# Patient Record
Sex: Female | Born: 1935 | Race: White | Hispanic: No | Smoking: Former smoker
Health system: Southern US, Community
[De-identification: ages and names within clinical notes are randomized; demographics above are authoritative.]

## PROBLEM LIST (undated history)

## (undated) DIAGNOSIS — F32A Depression, unspecified: Secondary | ICD-10-CM

## (undated) DIAGNOSIS — E079 Disorder of thyroid, unspecified: Secondary | ICD-10-CM

## (undated) DIAGNOSIS — I509 Heart failure, unspecified: Secondary | ICD-10-CM

## (undated) DIAGNOSIS — C801 Malignant (primary) neoplasm, unspecified: Secondary | ICD-10-CM

## (undated) DIAGNOSIS — R918 Other nonspecific abnormal finding of lung field: Secondary | ICD-10-CM

## (undated) DIAGNOSIS — I10 Essential (primary) hypertension: Secondary | ICD-10-CM

## (undated) DIAGNOSIS — J9611 Chronic respiratory failure with hypoxia: Secondary | ICD-10-CM

## (undated) DIAGNOSIS — F0390 Unspecified dementia without behavioral disturbance: Secondary | ICD-10-CM

## (undated) DIAGNOSIS — I4891 Unspecified atrial fibrillation: Secondary | ICD-10-CM

## (undated) DIAGNOSIS — F329 Major depressive disorder, single episode, unspecified: Secondary | ICD-10-CM

## (undated) DIAGNOSIS — K219 Gastro-esophageal reflux disease without esophagitis: Secondary | ICD-10-CM

## (undated) DIAGNOSIS — J449 Chronic obstructive pulmonary disease, unspecified: Secondary | ICD-10-CM

## (undated) DIAGNOSIS — D649 Anemia, unspecified: Secondary | ICD-10-CM

---

## 1997-05-29 ENCOUNTER — Other Ambulatory Visit: Admission: RE | Admit: 1997-05-29 | Discharge: 1997-05-29 | Payer: Self-pay | Admitting: Obstetrics and Gynecology

## 1998-08-02 ENCOUNTER — Other Ambulatory Visit: Admission: RE | Admit: 1998-08-02 | Discharge: 1998-08-02 | Payer: Self-pay | Admitting: Obstetrics and Gynecology

## 1999-11-15 ENCOUNTER — Other Ambulatory Visit: Admission: RE | Admit: 1999-11-15 | Discharge: 1999-11-15 | Payer: Self-pay | Admitting: Obstetrics and Gynecology

## 2007-07-29 ENCOUNTER — Ambulatory Visit (HOSPITAL_COMMUNITY): Admission: RE | Admit: 2007-07-29 | Discharge: 2007-07-29 | Payer: Self-pay | Admitting: Ophthalmology

## 2007-08-25 ENCOUNTER — Emergency Department (HOSPITAL_COMMUNITY): Admission: EM | Admit: 2007-08-25 | Discharge: 2007-08-25 | Payer: Self-pay | Admitting: Emergency Medicine

## 2007-09-02 ENCOUNTER — Ambulatory Visit (HOSPITAL_COMMUNITY): Admission: RE | Admit: 2007-09-02 | Discharge: 2007-09-02 | Payer: Self-pay | Admitting: Ophthalmology

## 2007-12-29 ENCOUNTER — Ambulatory Visit: Payer: Self-pay | Admitting: Cardiology

## 2008-10-25 ENCOUNTER — Ambulatory Visit: Payer: Self-pay | Admitting: Cardiology

## 2008-11-20 ENCOUNTER — Inpatient Hospital Stay: Admission: AD | Admit: 2008-11-20 | Discharge: 2008-12-20 | Payer: Self-pay | Admitting: Internal Medicine

## 2008-12-05 ENCOUNTER — Ambulatory Visit (HOSPITAL_COMMUNITY): Admission: RE | Admit: 2008-12-05 | Discharge: 2008-12-05 | Payer: Self-pay | Admitting: Internal Medicine

## 2008-12-06 ENCOUNTER — Encounter (HOSPITAL_BASED_OUTPATIENT_CLINIC_OR_DEPARTMENT_OTHER): Payer: Self-pay | Admitting: Internal Medicine

## 2008-12-06 ENCOUNTER — Encounter: Payer: Self-pay | Admitting: Orthopedic Surgery

## 2008-12-06 ENCOUNTER — Ambulatory Visit (HOSPITAL_COMMUNITY): Admission: RE | Admit: 2008-12-06 | Discharge: 2008-12-06 | Payer: Self-pay | Admitting: Internal Medicine

## 2008-12-12 ENCOUNTER — Ambulatory Visit (HOSPITAL_COMMUNITY): Admission: RE | Admit: 2008-12-12 | Discharge: 2008-12-12 | Payer: Self-pay | Admitting: Internal Medicine

## 2008-12-14 ENCOUNTER — Ambulatory Visit (HOSPITAL_COMMUNITY): Admission: RE | Admit: 2008-12-14 | Discharge: 2008-12-14 | Payer: Self-pay | Admitting: Pulmonary Disease

## 2009-02-11 ENCOUNTER — Ambulatory Visit: Payer: Self-pay | Admitting: Cardiology

## 2009-02-11 ENCOUNTER — Inpatient Hospital Stay (HOSPITAL_COMMUNITY): Admission: EM | Admit: 2009-02-11 | Discharge: 2009-02-26 | Payer: Self-pay | Admitting: Emergency Medicine

## 2009-02-22 ENCOUNTER — Encounter: Payer: Self-pay | Admitting: Cardiology

## 2009-02-26 ENCOUNTER — Inpatient Hospital Stay: Admission: AD | Admit: 2009-02-26 | Discharge: 2009-03-23 | Payer: Self-pay | Admitting: Family Medicine

## 2009-03-23 ENCOUNTER — Inpatient Hospital Stay (HOSPITAL_COMMUNITY): Admission: EM | Admit: 2009-03-23 | Discharge: 2009-03-26 | Payer: Self-pay | Admitting: Emergency Medicine

## 2009-03-26 ENCOUNTER — Inpatient Hospital Stay: Admission: AD | Admit: 2009-03-26 | Discharge: 2009-03-27 | Payer: Self-pay | Admitting: Pulmonary Disease

## 2009-03-27 ENCOUNTER — Inpatient Hospital Stay (HOSPITAL_COMMUNITY): Admission: EM | Admit: 2009-03-27 | Discharge: 2009-04-03 | Payer: Self-pay | Admitting: Emergency Medicine

## 2009-03-30 ENCOUNTER — Ambulatory Visit: Payer: Self-pay | Admitting: Gastroenterology

## 2009-03-31 ENCOUNTER — Ambulatory Visit: Payer: Self-pay | Admitting: Gastroenterology

## 2009-04-02 ENCOUNTER — Ambulatory Visit: Payer: Self-pay | Admitting: Gastroenterology

## 2009-04-02 ENCOUNTER — Encounter (INDEPENDENT_AMBULATORY_CARE_PROVIDER_SITE_OTHER): Payer: Self-pay | Admitting: Pulmonary Disease

## 2009-04-03 ENCOUNTER — Ambulatory Visit: Payer: Self-pay | Admitting: Internal Medicine

## 2009-04-03 ENCOUNTER — Inpatient Hospital Stay: Admission: AD | Admit: 2009-04-03 | Discharge: 2009-05-06 | Payer: Self-pay | Admitting: Pulmonary Disease

## 2009-04-09 ENCOUNTER — Ambulatory Visit (HOSPITAL_COMMUNITY): Admission: RE | Admit: 2009-04-09 | Discharge: 2009-04-09 | Payer: Self-pay | Admitting: Internal Medicine

## 2009-04-10 ENCOUNTER — Encounter (INDEPENDENT_AMBULATORY_CARE_PROVIDER_SITE_OTHER): Payer: Self-pay

## 2009-04-11 ENCOUNTER — Encounter (INDEPENDENT_AMBULATORY_CARE_PROVIDER_SITE_OTHER): Payer: Self-pay

## 2009-04-16 ENCOUNTER — Emergency Department (HOSPITAL_COMMUNITY): Admission: EM | Admit: 2009-04-16 | Discharge: 2009-04-16 | Payer: Self-pay | Admitting: Emergency Medicine

## 2009-05-06 ENCOUNTER — Inpatient Hospital Stay (HOSPITAL_COMMUNITY): Admission: EM | Admit: 2009-05-06 | Discharge: 2009-05-14 | Payer: Self-pay | Admitting: Emergency Medicine

## 2009-05-14 ENCOUNTER — Inpatient Hospital Stay
Admission: AD | Admit: 2009-05-14 | Discharge: 2010-02-13 | Payer: Self-pay | Source: Home / Self Care | Attending: Internal Medicine | Admitting: Internal Medicine

## 2009-05-15 ENCOUNTER — Encounter: Payer: Self-pay | Admitting: Gastroenterology

## 2009-05-17 ENCOUNTER — Ambulatory Visit (HOSPITAL_COMMUNITY): Admission: RE | Admit: 2009-05-17 | Discharge: 2009-05-17 | Payer: Self-pay | Admitting: Internal Medicine

## 2009-06-04 ENCOUNTER — Ambulatory Visit: Payer: Self-pay | Admitting: Orthopedic Surgery

## 2009-06-04 DIAGNOSIS — M19019 Primary osteoarthritis, unspecified shoulder: Secondary | ICD-10-CM | POA: Insufficient documentation

## 2009-06-21 ENCOUNTER — Emergency Department (HOSPITAL_COMMUNITY): Admission: EM | Admit: 2009-06-21 | Discharge: 2009-06-21 | Payer: Self-pay | Admitting: Emergency Medicine

## 2009-06-29 ENCOUNTER — Ambulatory Visit (HOSPITAL_COMMUNITY): Admission: RE | Admit: 2009-06-29 | Discharge: 2009-06-29 | Payer: Self-pay | Admitting: Internal Medicine

## 2009-09-27 ENCOUNTER — Ambulatory Visit (HOSPITAL_COMMUNITY): Admission: RE | Admit: 2009-09-27 | Discharge: 2009-09-27 | Payer: Self-pay | Admitting: Internal Medicine

## 2009-12-13 ENCOUNTER — Ambulatory Visit (HOSPITAL_COMMUNITY): Admission: RE | Admit: 2009-12-13 | Discharge: 2009-12-13 | Payer: Self-pay | Admitting: Internal Medicine

## 2009-12-16 ENCOUNTER — Emergency Department (HOSPITAL_COMMUNITY)
Admission: EM | Admit: 2009-12-16 | Discharge: 2009-12-16 | Payer: Self-pay | Source: Home / Self Care | Admitting: Emergency Medicine

## 2009-12-31 ENCOUNTER — Emergency Department (HOSPITAL_COMMUNITY)
Admission: EM | Admit: 2009-12-31 | Discharge: 2009-12-31 | Payer: Self-pay | Source: Home / Self Care | Admitting: Emergency Medicine

## 2010-02-13 ENCOUNTER — Emergency Department (HOSPITAL_COMMUNITY)
Admission: EM | Admit: 2010-02-13 | Discharge: 2010-02-13 | Disposition: A | Discharge status: Other Acute Inpatient Hospital | Payer: Self-pay | Source: Home / Self Care | Admitting: Emergency Medicine

## 2010-02-13 ENCOUNTER — Inpatient Hospital Stay (HOSPITAL_COMMUNITY)
Admission: EM | Admit: 2010-02-13 | Discharge: 2010-02-19 | Payer: Self-pay | Attending: Internal Medicine | Admitting: Internal Medicine

## 2010-02-13 ENCOUNTER — Ambulatory Visit (HOSPITAL_COMMUNITY): Admission: RE | Admit: 2010-02-13 | Payer: Self-pay | Source: Home / Self Care | Admitting: Internal Medicine

## 2010-02-14 ENCOUNTER — Encounter (INDEPENDENT_AMBULATORY_CARE_PROVIDER_SITE_OTHER): Payer: Self-pay | Admitting: Internal Medicine

## 2010-02-14 ENCOUNTER — Ambulatory Visit (HOSPITAL_COMMUNITY): Admission: RE | Admit: 2010-02-14 | Payer: Self-pay | Source: Home / Self Care | Admitting: Internal Medicine

## 2010-02-19 ENCOUNTER — Inpatient Hospital Stay
Admission: AD | Admit: 2010-02-19 | Discharge: 2010-03-04 | Payer: Self-pay | Source: Home / Self Care | Attending: Internal Medicine | Admitting: Internal Medicine

## 2010-02-27 LAB — GLUCOSE, CAPILLARY: Glucose-Capillary: 121 mg/dL — ABNORMAL HIGH (ref 70–99)

## 2010-02-28 LAB — GLUCOSE, CAPILLARY: Glucose-Capillary: 115 mg/dL — ABNORMAL HIGH (ref 70–99)

## 2010-03-01 LAB — GLUCOSE, CAPILLARY: Glucose-Capillary: 90 mg/dL (ref 70–99)

## 2010-03-02 ENCOUNTER — Ambulatory Visit (HOSPITAL_COMMUNITY)
Admission: RE | Admit: 2010-03-02 | Discharge: 2010-03-02 | Payer: Self-pay | Source: Home / Self Care | Attending: Internal Medicine | Admitting: Internal Medicine

## 2010-03-03 ENCOUNTER — Inpatient Hospital Stay (HOSPITAL_COMMUNITY)
Admission: EM | Admit: 2010-03-03 | Discharge: 2010-03-09 | Disposition: A | Discharge status: Still In Hospital | Payer: Self-pay | Source: Home / Self Care

## 2010-03-09 ENCOUNTER — Inpatient Hospital Stay
Admission: AD | Admit: 2010-03-09 | Discharge: 2010-07-03 | DRG: 948 | Disposition: A | Discharge status: Other Acute Inpatient Hospital | Payer: No Typology Code available for payment source | Attending: Internal Medicine | Admitting: Internal Medicine

## 2010-03-09 DIAGNOSIS — R062 Wheezing: Secondary | ICD-10-CM

## 2010-03-09 DIAGNOSIS — R05 Cough: Secondary | ICD-10-CM

## 2010-03-09 DIAGNOSIS — R0602 Shortness of breath: Secondary | ICD-10-CM

## 2010-03-09 DIAGNOSIS — Z139 Encounter for screening, unspecified: Principal | ICD-10-CM | POA: Diagnosis present

## 2010-03-11 LAB — COMPREHENSIVE METABOLIC PANEL
ALT: 40 U/L — ABNORMAL HIGH (ref 0–35)
AST: 31 U/L (ref 0–37)
Albumin: 3.2 g/dL — ABNORMAL LOW (ref 3.5–5.2)
Alkaline Phosphatase: 50 U/L (ref 39–117)
BUN: 14 mg/dL (ref 6–23)
CO2: 32 mEq/L (ref 19–32)
Calcium: 8 mg/dL — ABNORMAL LOW (ref 8.4–10.5)
Chloride: 89 mEq/L — ABNORMAL LOW (ref 96–112)
Creatinine, Ser: 0.64 mg/dL (ref 0.4–1.2)
GFR calc Af Amer: >60 mL/min (ref >60)
GFR calc non Af Amer: >60 mL/min (ref >60)
Glucose, Bld: 93 mg/dL (ref 70–99)
Potassium: 4.2 mEq/L (ref 3.5–5.1)
Sodium: 129 mEq/L — ABNORMAL LOW (ref 135–145)
Total Bilirubin: 0.6 mg/dL (ref 0.3–1.2)
Total Protein: 5.9 g/dL — ABNORMAL LOW (ref 6.0–8.3)

## 2010-03-11 LAB — BLOOD GAS, ARTERIAL
Acid-Base Excess: 8.6 mmol/L — ABNORMAL HIGH (ref 0.0–2.0)
Bicarbonate: 33.4 mEq/L — ABNORMAL HIGH (ref 20.0–24.0)
O2 Content: 5 L/min
O2 Saturation: 93.6 %
Patient temperature: 37
TCO2: 31 mmol/L (ref 0–100)
pCO2 arterial: 54.5 mmHg — ABNORMAL HIGH (ref 35.0–45.0)
pH, Arterial: 7.405 — ABNORMAL HIGH (ref 7.350–7.400)
pO2, Arterial: 69 mmHg — ABNORMAL LOW (ref 80.0–100.0)

## 2010-03-11 LAB — PROTIME-INR
INR: 1.6 — ABNORMAL HIGH (ref 0.00–1.49)
INR: 1.64 — ABNORMAL HIGH (ref 0.00–1.49)
INR: 1.95 — ABNORMAL HIGH (ref 0.00–1.49)
INR: 1.84 — ABNORMAL HIGH (ref 0.00–1.49)
INR: 1.79 — ABNORMAL HIGH (ref 0.00–1.49)
INR: 1.74 — ABNORMAL HIGH (ref 0.00–1.49)
INR: 1.87 — ABNORMAL HIGH (ref 0.00–1.49)
Prothrombin Time: 19.2 seconds — ABNORMAL HIGH (ref 11.6–15.2)
Prothrombin Time: 19.6 seconds — ABNORMAL HIGH (ref 11.6–15.2)
Prothrombin Time: 22.4 seconds — ABNORMAL HIGH (ref 11.6–15.2)
Prothrombin Time: 21.4 seconds — ABNORMAL HIGH (ref 11.6–15.2)
Prothrombin Time: 21 seconds — ABNORMAL HIGH (ref 11.6–15.2)
Prothrombin Time: 20.5 seconds — ABNORMAL HIGH (ref 11.6–15.2)
Prothrombin Time: 21.7 seconds — ABNORMAL HIGH (ref 11.6–15.2)

## 2010-03-11 LAB — DIFFERENTIAL
Basophils Absolute: 0 10*3/uL (ref 0.0–0.1)
Basophils Absolute: 0 10*3/uL (ref 0.0–0.1)
Basophils Absolute: 0 10*3/uL (ref 0.0–0.1)
Basophils Absolute: 0 10*3/uL (ref 0.0–0.1)
Basophils Relative: 0 % (ref 0–1)
Basophils Relative: 0 % (ref 0–1)
Basophils Relative: 0 % (ref 0–1)
Basophils Relative: 0 % (ref 0–1)
Eosinophils Absolute: 0 10*3/uL (ref 0.0–0.7)
Eosinophils Absolute: 0 10*3/uL (ref 0.0–0.7)
Eosinophils Absolute: 0 10*3/uL (ref 0.0–0.7)
Eosinophils Absolute: 0 10*3/uL (ref 0.0–0.7)
Eosinophils Relative: 0 % (ref 0–5)
Eosinophils Relative: 0 % (ref 0–5)
Eosinophils Relative: 0 % (ref 0–5)
Eosinophils Relative: 0 % (ref 0–5)
Lymphocytes Relative: 16 % (ref 12–46)
Lymphocytes Relative: 6 % — ABNORMAL LOW (ref 12–46)
Lymphocytes Relative: 5 % — ABNORMAL LOW (ref 12–46)
Lymphocytes Relative: 4 % — ABNORMAL LOW (ref 12–46)
Lymphs Abs: 1.4 10*3/uL (ref 0.7–4.0)
Lymphs Abs: 0.4 10*3/uL — ABNORMAL LOW (ref 0.7–4.0)
Lymphs Abs: 0.5 10*3/uL — ABNORMAL LOW (ref 0.7–4.0)
Lymphs Abs: 0.4 10*3/uL — ABNORMAL LOW (ref 0.7–4.0)
Monocytes Absolute: 0.9 10*3/uL (ref 0.1–1.0)
Monocytes Absolute: 0.1 10*3/uL (ref 0.1–1.0)
Monocytes Absolute: 0.3 10*3/uL (ref 0.1–1.0)
Monocytes Absolute: 0.4 10*3/uL (ref 0.1–1.0)
Monocytes Relative: 11 % (ref 3–12)
Monocytes Relative: 1 % — ABNORMAL LOW (ref 3–12)
Monocytes Relative: 4 % (ref 3–12)
Monocytes Relative: 4 % (ref 3–12)
Neutro Abs: 6 10*3/uL (ref 1.7–7.7)
Neutro Abs: 5.9 10*3/uL (ref 1.7–7.7)
Neutro Abs: 8.4 10*3/uL — ABNORMAL HIGH (ref 1.7–7.7)
Neutro Abs: 9.3 10*3/uL — ABNORMAL HIGH (ref 1.7–7.7)
Neutrophils Relative %: 72 % (ref 43–77)
Neutrophils Relative %: 93 % — ABNORMAL HIGH (ref 43–77)
Neutrophils Relative %: 91 % — ABNORMAL HIGH (ref 43–77)
Neutrophils Relative %: 92 % — ABNORMAL HIGH (ref 43–77)

## 2010-03-11 LAB — CBC
HCT: 31.2 % — ABNORMAL LOW (ref 36.0–46.0)
HCT: 29.2 % — ABNORMAL LOW (ref 36.0–46.0)
HCT: 28.9 % — ABNORMAL LOW (ref 36.0–46.0)
HCT: 28.4 % — ABNORMAL LOW (ref 36.0–46.0)
Hemoglobin: 10.1 g/dL — ABNORMAL LOW (ref 12.0–15.0)
Hemoglobin: 9.5 g/dL — ABNORMAL LOW (ref 12.0–15.0)
Hemoglobin: 9.2 g/dL — ABNORMAL LOW (ref 12.0–15.0)
Hemoglobin: 9 g/dL — ABNORMAL LOW (ref 12.0–15.0)
MCH: 26.3 pg (ref 26.0–34.0)
MCH: 26.3 pg (ref 26.0–34.0)
MCH: 25.8 pg — ABNORMAL LOW (ref 26.0–34.0)
MCH: 26 pg (ref 26.0–34.0)
MCHC: 32.4 g/dL (ref 30.0–36.0)
MCHC: 32.5 g/dL (ref 30.0–36.0)
MCHC: 31.8 g/dL (ref 30.0–36.0)
MCHC: 31.7 g/dL (ref 30.0–36.0)
MCV: 81.3 fL (ref 78.0–100.0)
MCV: 80.9 fL (ref 78.0–100.0)
MCV: 81 fL (ref 78.0–100.0)
MCV: 82.1 fL (ref 78.0–100.0)
Platelets: 137 10*3/uL — ABNORMAL LOW (ref 150–400)
Platelets: 135 10*3/uL — ABNORMAL LOW (ref 150–400)
Platelets: 131 10*3/uL — ABNORMAL LOW (ref 150–400)
Platelets: 139 10*3/uL — ABNORMAL LOW (ref 150–400)
RBC: 3.84 MIL/uL — ABNORMAL LOW (ref 3.87–5.11)
RBC: 3.61 MIL/uL — ABNORMAL LOW (ref 3.87–5.11)
RBC: 3.57 MIL/uL — ABNORMAL LOW (ref 3.87–5.11)
RBC: 3.46 MIL/uL — ABNORMAL LOW (ref 3.87–5.11)
RDW: 15.9 % — ABNORMAL HIGH (ref 11.5–15.5)
RDW: 15.6 % — ABNORMAL HIGH (ref 11.5–15.5)
RDW: 15.5 % (ref 11.5–15.5)
RDW: 15.9 % — ABNORMAL HIGH (ref 11.5–15.5)
WBC: 8.3 10*3/uL (ref 4.0–10.5)
WBC: 6.3 10*3/uL (ref 4.0–10.5)
WBC: 9.2 10*3/uL (ref 4.0–10.5)
WBC: 10.2 10*3/uL (ref 4.0–10.5)

## 2010-03-11 LAB — GLUCOSE, CAPILLARY
Glucose-Capillary: 114 mg/dL — ABNORMAL HIGH (ref 70–99)
Glucose-Capillary: 162 mg/dL — ABNORMAL HIGH (ref 70–99)
Glucose-Capillary: 135 mg/dL — ABNORMAL HIGH (ref 70–99)
Glucose-Capillary: 152 mg/dL — ABNORMAL HIGH (ref 70–99)
Glucose-Capillary: 136 mg/dL — ABNORMAL HIGH (ref 70–99)
Glucose-Capillary: 200 mg/dL — ABNORMAL HIGH (ref 70–99)
Glucose-Capillary: 159 mg/dL — ABNORMAL HIGH (ref 70–99)
Glucose-Capillary: 93 mg/dL (ref 70–99)
Glucose-Capillary: 132 mg/dL — ABNORMAL HIGH (ref 70–99)
Glucose-Capillary: 191 mg/dL — ABNORMAL HIGH (ref 70–99)
Glucose-Capillary: 207 mg/dL — ABNORMAL HIGH (ref 70–99)
Glucose-Capillary: 155 mg/dL — ABNORMAL HIGH (ref 70–99)
Glucose-Capillary: 147 mg/dL — ABNORMAL HIGH (ref 70–99)
Glucose-Capillary: 148 mg/dL — ABNORMAL HIGH (ref 70–99)
Glucose-Capillary: 226 mg/dL — ABNORMAL HIGH (ref 70–99)
Glucose-Capillary: 174 mg/dL — ABNORMAL HIGH (ref 70–99)
Glucose-Capillary: 151 mg/dL — ABNORMAL HIGH (ref 70–99)

## 2010-03-11 LAB — BASIC METABOLIC PANEL
BUN: 14 mg/dL (ref 6–23)
BUN: 15 mg/dL (ref 6–23)
BUN: 20 mg/dL (ref 6–23)
CO2: 32 mEq/L (ref 19–32)
CO2: 31 mEq/L (ref 19–32)
CO2: 32 mEq/L (ref 19–32)
Calcium: 8.2 mg/dL — ABNORMAL LOW (ref 8.4–10.5)
Calcium: 8.6 mg/dL (ref 8.4–10.5)
Calcium: 8.8 mg/dL (ref 8.4–10.5)
Chloride: 101 mEq/L (ref 96–112)
Chloride: 101 mEq/L (ref 96–112)
Chloride: 102 mEq/L (ref 96–112)
Creatinine, Ser: 0.49 mg/dL (ref 0.4–1.2)
Creatinine, Ser: 0.56 mg/dL (ref 0.4–1.2)
Creatinine, Ser: 0.64 mg/dL (ref 0.4–1.2)
GFR calc Af Amer: >60 mL/min (ref >60)
GFR calc Af Amer: >60 mL/min (ref >60)
GFR calc Af Amer: >60 mL/min (ref >60)
GFR calc non Af Amer: >60 mL/min (ref >60)
GFR calc non Af Amer: >60 mL/min (ref >60)
GFR calc non Af Amer: >60 mL/min (ref >60)
Glucose, Bld: 160 mg/dL — ABNORMAL HIGH (ref 70–99)
Glucose, Bld: 160 mg/dL — ABNORMAL HIGH (ref 70–99)
Glucose, Bld: 146 mg/dL — ABNORMAL HIGH (ref 70–99)
Potassium: 4.3 mEq/L (ref 3.5–5.1)
Potassium: 4.2 mEq/L (ref 3.5–5.1)
Potassium: 4.3 mEq/L (ref 3.5–5.1)
Sodium: 139 mEq/L (ref 135–145)
Sodium: 140 mEq/L (ref 135–145)
Sodium: 140 mEq/L (ref 135–145)

## 2010-03-11 LAB — URINALYSIS, MICROSCOPIC ONLY
Bilirubin Urine: NEGATIVE
Hgb urine dipstick: NEGATIVE
Leukocytes, UA: NEGATIVE
Nitrite: NEGATIVE
Protein, ur: NEGATIVE mg/dL
Specific Gravity, Urine: 1.02 (ref 1.005–1.030)
Urine Glucose, Fasting: NEGATIVE mg/dL
Urobilinogen, UA: 0.2 mg/dL (ref 0.0–1.0)
pH: 7 (ref 5.0–8.0)

## 2010-03-11 LAB — CULTURE, BLOOD (ROUTINE X 2)
Culture: NO GROWTH
Culture: NO GROWTH

## 2010-03-11 LAB — URINE CULTURE
Colony Count: >=100000
Culture  Setup Time: 201201091315

## 2010-03-11 LAB — CK TOTAL AND CKMB (NOT AT ARMC)
CK, MB: 3 ng/mL (ref 0.3–4.0)
Relative Index: INVALID (ref 0.0–2.5)
Total CK: 96 U/L (ref 7–177)

## 2010-03-11 LAB — HEPATIC FUNCTION PANEL
ALT: 37 U/L — ABNORMAL HIGH (ref 0–35)
AST: 29 U/L (ref 0–37)
Albumin: 3 g/dL — ABNORMAL LOW (ref 3.5–5.2)
Alkaline Phosphatase: 46 U/L (ref 39–117)
Bilirubin, Direct: 0.1 mg/dL (ref 0.0–0.3)
Indirect Bilirubin: 0.3 mg/dL (ref 0.3–0.9)
Total Bilirubin: 0.4 mg/dL (ref 0.3–1.2)
Total Protein: 5.7 g/dL — ABNORMAL LOW (ref 6.0–8.3)

## 2010-03-11 LAB — TROPONIN I: Troponin I: 0.01 ng/mL (ref 0.00–0.06)

## 2010-03-11 LAB — LACTIC ACID, PLASMA: Lactic Acid, Venous: 1.1 mmol/L (ref 0.5–2.2)

## 2010-03-11 LAB — MRSA PCR SCREENING: MRSA by PCR: NEGATIVE

## 2010-03-11 LAB — DIGOXIN LEVEL: Digoxin Level: 1.4 ng/mL (ref 0.8–2.0)

## 2010-03-11 LAB — PROLACTIN: Prolactin: 24.3 ng/mL

## 2010-03-11 LAB — BRAIN NATRIURETIC PEPTIDE: Pro B Natriuretic peptide (BNP): 84.2 pg/mL (ref 0.0–100.0)

## 2010-03-13 LAB — GLUCOSE, CAPILLARY
Glucose-Capillary: 109 mg/dL — ABNORMAL HIGH (ref 70–99)
Glucose-Capillary: 98 mg/dL (ref 70–99)
Glucose-Capillary: 124 mg/dL — ABNORMAL HIGH (ref 70–99)
Glucose-Capillary: 122 mg/dL — ABNORMAL HIGH (ref 70–99)
Glucose-Capillary: 186 mg/dL — ABNORMAL HIGH (ref 70–99)

## 2010-03-17 ENCOUNTER — Encounter (HOSPITAL_BASED_OUTPATIENT_CLINIC_OR_DEPARTMENT_OTHER): Payer: Self-pay | Admitting: Internal Medicine

## 2010-03-18 LAB — GLUCOSE, CAPILLARY
Glucose-Capillary: 117 mg/dL — ABNORMAL HIGH (ref 70–99)
Glucose-Capillary: 131 mg/dL — ABNORMAL HIGH (ref 70–99)
Glucose-Capillary: 204 mg/dL — ABNORMAL HIGH (ref 70–99)

## 2010-03-19 LAB — GLUCOSE, CAPILLARY
Glucose-Capillary: 98 mg/dL (ref 70–99)
Glucose-Capillary: 295 mg/dL — ABNORMAL HIGH (ref 70–99)
Glucose-Capillary: 334 mg/dL — ABNORMAL HIGH (ref 70–99)
Glucose-Capillary: 88 mg/dL (ref 70–99)
Glucose-Capillary: 171 mg/dL — ABNORMAL HIGH (ref 70–99)
Glucose-Capillary: 110 mg/dL — ABNORMAL HIGH (ref 70–99)

## 2010-03-20 LAB — GLUCOSE, CAPILLARY
Glucose-Capillary: 125 mg/dL — ABNORMAL HIGH (ref 70–99)
Glucose-Capillary: 99 mg/dL (ref 70–99)
Glucose-Capillary: 111 mg/dL — ABNORMAL HIGH (ref 70–99)

## 2010-03-22 LAB — GLUCOSE, CAPILLARY: Glucose-Capillary: 132 mg/dL — ABNORMAL HIGH (ref 70–99)

## 2010-03-23 LAB — GLUCOSE, CAPILLARY: Glucose-Capillary: 104 mg/dL — ABNORMAL HIGH (ref 70–99)

## 2010-03-24 LAB — GLUCOSE, CAPILLARY: Glucose-Capillary: 116 mg/dL — ABNORMAL HIGH (ref 70–99)

## 2010-03-25 LAB — GLUCOSE, CAPILLARY: Glucose-Capillary: 128 mg/dL — ABNORMAL HIGH (ref 70–99)

## 2010-03-26 LAB — GLUCOSE, CAPILLARY
Glucose-Capillary: 98 mg/dL (ref 70–99)
Glucose-Capillary: 134 mg/dL — ABNORMAL HIGH (ref 70–99)

## 2010-03-27 LAB — GLUCOSE, CAPILLARY: Glucose-Capillary: 139 mg/dL — ABNORMAL HIGH (ref 70–99)

## 2010-03-28 LAB — GLUCOSE, CAPILLARY
Glucose-Capillary: 99 mg/dL (ref 70–99)
Glucose-Capillary: 132 mg/dL — ABNORMAL HIGH (ref 70–99)

## 2010-03-28 NOTE — Letter (Signed)
Summary: History form  History form   Imported By: Jacklynn Ganong 06/05/2009 13:52:26  _____________________________________________________________________  External Attachment:    Type:   Image     Comment:   External Document

## 2010-03-28 NOTE — Consult Note (Signed)
Summary: Consultation Report  Consultation Report   Imported By: Ricard Dillon 04/02/2009 14:28:08  _____________________________________________________________________  External Attachment:    Type:   Image     Comment:   External Document

## 2010-03-28 NOTE — Letter (Signed)
Summary: previous note brought by the patient  previous note brought by the patient   Imported By: Jacklynn Ganong 06/05/2009 13:53:33  _____________________________________________________________________  External Attachment:    Type:   Image     Comment:   External Document

## 2010-03-28 NOTE — Letter (Signed)
Summary: Plan of Care, Need to Discuss  [facility]  [address]   [place], [place] [address]   Phone: [phone]  Fax: [phone]    April 11, 2009  [place] [address] [place], [place]  [address] 1933/06/12   Dear Ms. Benge,   We are writing this letter to inform you of treatment plans and/or discuss your plan of care.  We have tried several times to contact you; however, we have yet to reach you.  We ask that you please contact our office for follow-up on your gastrointestinal issues.  We can  be reached at [phone] to schedule an appointment, or to speak with someone regarding your health care needs.  Please do not neglect your health.   Sincerely,    Cloria Spring LPN  [facility] Ph: [phone]    Fax: [phone]

## 2010-03-28 NOTE — Letter (Signed)
Summary: Internal Other /Fax to [facility]/04/11/09  Internal Other /Fax to [facility]/04/11/09   Imported By: Cloria Spring LPN 04/54/0981 19:14:78  _____________________________________________________________________  External Attachment:    Type:   Image     Comment:   External Document

## 2010-03-28 NOTE — Letter (Signed)
Summary: Plan of Care, Need to Discuss  [facility]  [address]   [place], [place] [address]   Phone: [phone]  Fax: [phone]    April 10, 2009  [place] [address] [place], [place]  [address] 04-May-1933   Dear Ms. Lindbloom,   We are writing this letter to inform you of treatment plans and/or discuss your plan of care.  We have tried several times to contact you; however, we have yet to reach you.  We ask that you please contact our office for follow-up on your gastrointestinal issues.  We can  be reached at [phone] to schedule an appointment, or to speak with someone regarding your health care needs.  Please do not neglect your health.   Sincerely,    Cloria Spring LPN  [facility] Ph: [phone]    Fax: [phone]

## 2010-03-28 NOTE — Miscellaneous (Signed)
Summary: [facility] order  [facility] order   Imported By: Jacklynn Ganong 06/13/2009 16:38:43  _____________________________________________________________________  External Attachment:    Type:   Image     Comment:   External Document

## 2010-03-28 NOTE — Assessment & Plan Note (Signed)
Summary: rt shoulder pain needs xr req injection/bsf   Vital Signs:  Patient profile:   77 year old female Height:      62 inches Weight:      176 pounds Pulse rate:   70 / minute Resp:     16 per minute  Visit Type:  new patient Referring Provider:  self Primary Provider:  Dr. Juanetta Gosling  CC:  right shoulder pain.  History of Present Illness: 77 years old severe COPD resides at the pain center previously evaluated by Dr. Edger House she has RIGHT shoulder glenohumeral arthritis she's had pain in the RIGHT shoulder for 3 years no injury.  She did get an injection it helped for 2 months.  She has sharp dull throbbing stabbing pain which is an 8/10 it is constant it is worse with motion in the RIGHT arm.  She does get some relief from Vicodin one t.i.d. and one at night.  Therapy seems to aggravate the shoulder.  Xrays [facility] 12/06/08 rt shoulder.  Vicodin, oxygen, Mysoline, Zoloft, Lanoxin, Doxepin, Diltiazem, Spiriva, Iron, Protonix, Coumadin.    Allergies (verified): No Known Drug Allergies  Past History:  Past Medical History: COPD A fib htn anemia anxiety depression obesity goiter  Past Surgical History: na  Family History: FH of Cancer:  Hx, family, chronic respiratory condition  Social History: Patient is widowed.  unemployed no smoking no alcohol 1 tea every other day  Review of Systems Constitutional:  Complains of fatigue; denies weight loss, weight gain, fever, and chills. Cardiovascular:  Complains of palpitations; denies chest pain, fainting, and murmurs. Respiratory:  Complains of short of breath, wheezing, couch, tightness, pain on inspiration, and snoring; denies snoring . Gastrointestinal:  Complains of diarrhea; denies heartburn, nausea, vomiting, constipation, and blood in your stools. Genitourinary:  Complains of frequency and urgency; denies difficulty urinating, painful urination, flank pain, and bleeding in urine. Neurologic:  Complains of  tremors; denies numbness, tingling, unsteady gait, dizziness, and seizure. Musculoskeletal:  Complains of joint pain and swelling; denies instability, stiffness, redness, heat, and muscle pain. Endocrine:  Denies excessive thirst, exessive urination, and heat or cold intolerance. Psychiatric:  Complains of nervousness, depression, and anxiety; denies hallucinations. Skin:  Denies changes in the skin, poor healing, rash, itching, and redness. HEENT:  Denies blurred or double vision, eye pain, redness, and watering. Immunology:  Denies seasonal allergies, sinus problems, and allergic to bee stings. Hemoatologic:  Complains of easy bleeding and brusing.  Physical Exam  Additional Exam:  plan are stable as recorded  Her appearance was normal she had oxygen on  Show normal pulse in the RIGHT upper extremity was no swelling  She had no lymph nodes in the cervical spine area  She has painful crepitance on range of motion and had 120 of active motion positive impingement sign, passive motion was 150, stroke was stable.  She had weakness in the supraspinatus tendon.  Skin was intact.  Reflex on the RIGHT normal.  Sensation of the RIGHT normal.  She was oriented x3.  Mood and affect normal.   Impression & Recommendations:  Problem # 1:  ARTHRITIS, SHOULDER (ICD-716.91) Assessment New  Orders: New Patient Level III (04540) Joint Aspirate / Injection, Large (20610) Depo- Medrol 40mg  (J1030)  RIGHT shoulder and subacromial space Verbal consent obtained/The shoulder was injected with depomedrol 40mg /cc and sensorcaine .25% . There were no complications

## 2010-03-28 NOTE — Miscellaneous (Signed)
Summary: Nursing Home orders  Nursing Home orders   Imported By: Jacklynn Ganong 06/05/2009 13:54:19  _____________________________________________________________________  External Attachment:    Type:   Image     Comment:   External Document

## 2010-03-29 LAB — GLUCOSE, CAPILLARY: Glucose-Capillary: 98 mg/dL (ref 70–99)

## 2010-03-30 LAB — GLUCOSE, CAPILLARY: Glucose-Capillary: 100 mg/dL — ABNORMAL HIGH (ref 70–99)

## 2010-03-31 LAB — GLUCOSE, CAPILLARY: Glucose-Capillary: 163 mg/dL — ABNORMAL HIGH (ref 70–99)

## 2010-04-02 LAB — GLUCOSE, CAPILLARY: Glucose-Capillary: 97 mg/dL (ref 70–99)

## 2010-04-04 LAB — GLUCOSE, CAPILLARY
Glucose-Capillary: 101 mg/dL — ABNORMAL HIGH (ref 70–99)
Glucose-Capillary: 129 mg/dL — ABNORMAL HIGH (ref 70–99)

## 2010-04-09 LAB — GLUCOSE, CAPILLARY: Glucose-Capillary: 99 mg/dL (ref 70–99)

## 2010-04-11 LAB — GLUCOSE, CAPILLARY: Glucose-Capillary: 96 mg/dL (ref 70–99)

## 2010-04-25 LAB — GLUCOSE, CAPILLARY: Glucose-Capillary: 106 mg/dL — ABNORMAL HIGH (ref 70–99)

## 2010-04-30 LAB — GLUCOSE, CAPILLARY: Glucose-Capillary: 107 mg/dL — ABNORMAL HIGH (ref 70–99)

## 2010-05-03 LAB — GLUCOSE, CAPILLARY: Glucose-Capillary: 145 mg/dL — ABNORMAL HIGH (ref 70–99)

## 2010-05-06 LAB — CBC
HCT: 34.6 % — ABNORMAL LOW (ref 36.0–46.0)
HCT: 35 % — ABNORMAL LOW (ref 36.0–46.0)
HCT: 35.7 % — ABNORMAL LOW (ref 36.0–46.0)
Hemoglobin: 11.3 g/dL — ABNORMAL LOW (ref 12.0–15.0)
Hemoglobin: 10.4 g/dL — ABNORMAL LOW (ref 12.0–15.0)
Hemoglobin: 11 g/dL — ABNORMAL LOW (ref 12.0–15.0)
Hemoglobin: 11.3 g/dL — ABNORMAL LOW (ref 12.0–15.0)
MCH: 26.7 pg (ref 26.0–34.0)
MCHC: 30.5 g/dL (ref 30.0–36.0)
MCHC: 32.3 g/dL (ref 30.0–36.0)
MCV: 82.5 fL (ref 78.0–100.0)
MCV: 83.8 fL (ref 78.0–100.0)
Platelets: 246 10*3/uL (ref 150–400)
RBC: 4.23 MIL/uL (ref 3.87–5.11)
RBC: 4.09 MIL/uL (ref 3.87–5.11)
RBC: 4.26 MIL/uL (ref 3.87–5.11)
RDW: 14.6 % (ref 11.5–15.5)
RDW: 14.6 % (ref 11.5–15.5)
RDW: 14.6 % (ref 11.5–15.5)
RDW: 14.7 % (ref 11.5–15.5)
WBC: 12.7 10*3/uL — ABNORMAL HIGH (ref 4.0–10.5)
WBC: 14.5 10*3/uL — ABNORMAL HIGH (ref 4.0–10.5)
WBC: 24.3 10*3/uL — ABNORMAL HIGH (ref 4.0–10.5)
WBC: 9.9 10*3/uL (ref 4.0–10.5)

## 2010-05-06 LAB — GLUCOSE, CAPILLARY
Glucose-Capillary: 113 mg/dL — ABNORMAL HIGH (ref 70–99)
Glucose-Capillary: 114 mg/dL — ABNORMAL HIGH (ref 70–99)
Glucose-Capillary: 118 mg/dL — ABNORMAL HIGH (ref 70–99)
Glucose-Capillary: 138 mg/dL — ABNORMAL HIGH (ref 70–99)
Glucose-Capillary: 141 mg/dL — ABNORMAL HIGH (ref 70–99)
Glucose-Capillary: 212 mg/dL — ABNORMAL HIGH (ref 70–99)
Glucose-Capillary: 92 mg/dL (ref 70–99)
Glucose-Capillary: 96 mg/dL (ref 70–99)
Glucose-Capillary: 132 mg/dL — ABNORMAL HIGH (ref 70–99)
Glucose-Capillary: 134 mg/dL — ABNORMAL HIGH (ref 70–99)
Glucose-Capillary: 166 mg/dL — ABNORMAL HIGH (ref 70–99)
Glucose-Capillary: 171 mg/dL — ABNORMAL HIGH (ref 70–99)
Glucose-Capillary: 198 mg/dL — ABNORMAL HIGH (ref 70–99)
Glucose-Capillary: 212 mg/dL — ABNORMAL HIGH (ref 70–99)
Glucose-Capillary: 281 mg/dL — ABNORMAL HIGH (ref 70–99)
Glucose-Capillary: 89 mg/dL (ref 70–99)
Glucose-Capillary: 95 mg/dL (ref 70–99)

## 2010-05-06 LAB — COMPREHENSIVE METABOLIC PANEL
ALT: 37 U/L — ABNORMAL HIGH (ref 0–35)
AST: 40 U/L — ABNORMAL HIGH (ref 0–37)
AST: 38 U/L — ABNORMAL HIGH (ref 0–37)
AST: 75 U/L — ABNORMAL HIGH (ref 0–37)
Albumin: 3.5 g/dL (ref 3.5–5.2)
Alkaline Phosphatase: 58 U/L (ref 39–117)
Alkaline Phosphatase: 52 U/L (ref 39–117)
BUN: 12 mg/dL (ref 6–23)
CO2: 32 mEq/L (ref 19–32)
CO2: 30 mEq/L (ref 19–32)
Calcium: 8.6 mg/dL (ref 8.4–10.5)
Chloride: 98 mEq/L (ref 96–112)
Chloride: 96 mEq/L (ref 96–112)
Chloride: 97 mEq/L (ref 96–112)
Creatinine, Ser: 0.68 mg/dL (ref 0.4–1.2)
Creatinine, Ser: 0.69 mg/dL (ref 0.4–1.2)
GFR calc Af Amer: >60 mL/min (ref >60)
GFR calc Af Amer: >60 mL/min (ref >60)
GFR calc non Af Amer: >60 mL/min (ref >60)
GFR calc non Af Amer: >60 mL/min (ref >60)
Glucose, Bld: 170 mg/dL — ABNORMAL HIGH (ref 70–99)
Potassium: 3.9 mEq/L (ref 3.5–5.1)
Potassium: 3.9 mEq/L (ref 3.5–5.1)
Sodium: 136 mEq/L (ref 135–145)
Total Bilirubin: 0.5 mg/dL (ref 0.3–1.2)
Total Bilirubin: 0.6 mg/dL (ref 0.3–1.2)
Total Bilirubin: 0.6 mg/dL (ref 0.3–1.2)
Total Protein: 6.7 g/dL (ref 6.0–8.3)

## 2010-05-06 LAB — URINE CULTURE: Colony Count: 60000

## 2010-05-06 LAB — PROTIME-INR
INR: 1.46 (ref 0.00–1.49)
INR: 2.41 — ABNORMAL HIGH (ref 0.00–1.49)
INR: 2.58 — ABNORMAL HIGH (ref 0.00–1.49)
Prothrombin Time: 17 seconds — ABNORMAL HIGH (ref 11.6–15.2)
Prothrombin Time: 21.3 seconds — ABNORMAL HIGH (ref 11.6–15.2)
Prothrombin Time: 26.4 seconds — ABNORMAL HIGH (ref 11.6–15.2)

## 2010-05-06 LAB — BRAIN NATRIURETIC PEPTIDE
Pro B Natriuretic peptide (BNP): 144 pg/mL — ABNORMAL HIGH (ref 0.0–100.0)
Pro B Natriuretic peptide (BNP): 152 pg/mL — ABNORMAL HIGH (ref 0.0–100.0)
Pro B Natriuretic peptide (BNP): 182 pg/mL — ABNORMAL HIGH (ref 0.0–100.0)
Pro B Natriuretic peptide (BNP): 286 pg/mL — ABNORMAL HIGH (ref 0.0–100.0)

## 2010-05-06 LAB — BASIC METABOLIC PANEL
BUN: 15 mg/dL (ref 6–23)
BUN: 20 mg/dL (ref 6–23)
Calcium: 8.9 mg/dL (ref 8.4–10.5)
Chloride: 98 mEq/L (ref 96–112)
GFR calc non Af Amer: >60 mL/min (ref >60)
GFR calc non Af Amer: >60 mL/min (ref >60)
Glucose, Bld: 166 mg/dL — ABNORMAL HIGH (ref 70–99)
Potassium: 3.3 mEq/L — ABNORMAL LOW (ref 3.5–5.1)
Potassium: 3.3 mEq/L — ABNORMAL LOW (ref 3.5–5.1)
Sodium: 143 mEq/L (ref 135–145)
Sodium: 145 mEq/L (ref 135–145)

## 2010-05-06 LAB — URINALYSIS, ROUTINE W REFLEX MICROSCOPIC
Bilirubin Urine: NEGATIVE
Ketones, ur: NEGATIVE mg/dL
Protein, ur: NEGATIVE mg/dL
Urobilinogen, UA: 0.2 mg/dL (ref 0.0–1.0)

## 2010-05-06 LAB — CULTURE, BLOOD (ROUTINE X 2)
Culture  Setup Time: 201112220830
Culture: NO GROWTH
Culture: NO GROWTH

## 2010-05-06 LAB — BLOOD GAS, ARTERIAL
Acid-Base Excess: 5.8 mmol/L — ABNORMAL HIGH (ref 0.0–2.0)
Bicarbonate: 30 mEq/L — ABNORMAL HIGH (ref 20.0–24.0)
Bicarbonate: 31.4 mEq/L — ABNORMAL HIGH (ref 20.0–24.0)
O2 Content: 2 L/min
O2 Saturation: 89.5 %
O2 Saturation: 91 %
Patient temperature: 37
pH, Arterial: 7.436 — ABNORMAL HIGH (ref 7.350–7.400)
pO2, Arterial: 54.9 mmHg — ABNORMAL LOW (ref 80.0–100.0)

## 2010-05-06 LAB — TSH
TSH: 2.182 u[IU]/mL (ref 0.350–4.500)
TSH: 0.512 u[IU]/mL (ref 0.350–4.500)

## 2010-05-06 LAB — LEGIONELLA ANTIGEN, URINE: Legionella Antigen, Urine: NEGATIVE

## 2010-05-06 LAB — TYPE AND SCREEN: ABO/RH(D): B POS

## 2010-05-06 LAB — DIFFERENTIAL
Basophils Absolute: 0.1 10*3/uL (ref 0.0–0.1)
Basophils Absolute: 0 10*3/uL (ref 0.0–0.1)
Basophils Relative: 0 % (ref 0–1)
Basophils Relative: 0 % (ref 0–1)
Eosinophils Relative: 0 % (ref 0–5)
Eosinophils Relative: 0 % (ref 0–5)
Monocytes Absolute: 1.4 10*3/uL — ABNORMAL HIGH (ref 0.1–1.0)
Monocytes Absolute: 0.4 10*3/uL (ref 0.1–1.0)
Monocytes Relative: 6 % (ref 3–12)
Monocytes Relative: 2 % — ABNORMAL LOW (ref 3–12)
Neutro Abs: 23.1 10*3/uL — ABNORMAL HIGH (ref 1.7–7.7)

## 2010-05-06 LAB — MAGNESIUM: Magnesium: 2.4 mg/dL (ref 1.5–2.5)

## 2010-05-06 LAB — PHOSPHORUS: Phosphorus: 2.9 mg/dL (ref 2.3–4.6)

## 2010-05-06 LAB — CARDIAC PANEL(CRET KIN+CKTOT+MB+TROPI): CK, MB: 3.9 ng/mL (ref 0.3–4.0)

## 2010-05-06 LAB — HEMOGLOBIN A1C: Mean Plasma Glucose: 134 mg/dL — ABNORMAL HIGH (ref <117)

## 2010-05-06 LAB — MRSA PCR SCREENING: MRSA by PCR: NEGATIVE

## 2010-05-07 LAB — DIGOXIN LEVEL: Digoxin Level: 1.6 ng/mL (ref 0.8–2.0)

## 2010-05-07 LAB — COMPREHENSIVE METABOLIC PANEL
ALT: 29 U/L (ref 0–35)
Albumin: 3.6 g/dL (ref 3.5–5.2)
Alkaline Phosphatase: 54 U/L (ref 39–117)
Calcium: 8.5 mg/dL (ref 8.4–10.5)
GFR calc Af Amer: >60 mL/min (ref >60)
Potassium: 3.7 mEq/L (ref 3.5–5.1)
Sodium: 139 mEq/L (ref 135–145)
Total Protein: 7.1 g/dL (ref 6.0–8.3)

## 2010-05-07 LAB — GLUCOSE, CAPILLARY
Glucose-Capillary: 105 mg/dL — ABNORMAL HIGH (ref 70–99)
Glucose-Capillary: 118 mg/dL — ABNORMAL HIGH (ref 70–99)
Glucose-Capillary: 128 mg/dL — ABNORMAL HIGH (ref 70–99)
Glucose-Capillary: 154 mg/dL — ABNORMAL HIGH (ref 70–99)
Glucose-Capillary: 91 mg/dL (ref 70–99)
Glucose-Capillary: 96 mg/dL (ref 70–99)

## 2010-05-07 LAB — CBC
Platelets: 279 10*3/uL (ref 150–400)
RDW: 15.6 % — ABNORMAL HIGH (ref 11.5–15.5)
WBC: 10.1 10*3/uL (ref 4.0–10.5)

## 2010-05-07 LAB — CULTURE, BLOOD (ROUTINE X 2): Culture: NO GROWTH

## 2010-05-07 LAB — DIFFERENTIAL
Basophils Relative: 1 % (ref 0–1)
Eosinophils Absolute: 0 10*3/uL (ref 0.0–0.7)
Lymphs Abs: 1.4 10*3/uL (ref 0.7–4.0)
Monocytes Absolute: 0.7 10*3/uL (ref 0.1–1.0)
Monocytes Relative: 7 % (ref 3–12)

## 2010-05-08 LAB — GLUCOSE, CAPILLARY: Glucose-Capillary: 102 mg/dL — ABNORMAL HIGH (ref 70–99)

## 2010-05-08 LAB — PROTIME-INR: Prothrombin Time: 24.9 seconds — ABNORMAL HIGH (ref 11.6–15.2)

## 2010-05-09 LAB — GLUCOSE, CAPILLARY
Glucose-Capillary: 113 mg/dL — ABNORMAL HIGH (ref 70–99)
Glucose-Capillary: 117 mg/dL — ABNORMAL HIGH (ref 70–99)
Glucose-Capillary: 322 mg/dL — ABNORMAL HIGH (ref 70–99)

## 2010-05-12 LAB — CBC
Hemoglobin: 10 g/dL — ABNORMAL LOW (ref 12.0–15.0)
MCHC: 31.4 g/dL (ref 30.0–36.0)
MCHC: 31.7 g/dL (ref 30.0–36.0)
Platelets: 196 10*3/uL (ref 150–400)
Platelets: 390 10*3/uL (ref 150–400)
Platelets: 492 10*3/uL — ABNORMAL HIGH (ref 150–400)
RBC: 3.44 MIL/uL — ABNORMAL LOW (ref 3.87–5.11)
RDW: 18.2 % — ABNORMAL HIGH (ref 11.5–15.5)
RDW: 21.9 % — ABNORMAL HIGH (ref 11.5–15.5)
WBC: 11.7 10*3/uL — ABNORMAL HIGH (ref 4.0–10.5)

## 2010-05-12 LAB — DIFFERENTIAL
Basophils Absolute: 0 10*3/uL (ref 0.0–0.1)
Basophils Absolute: 0 10*3/uL (ref 0.0–0.1)
Basophils Relative: 0 % (ref 0–1)
Basophils Relative: 0 % (ref 0–1)
Eosinophils Relative: 0 % (ref 0–5)
Eosinophils Relative: 0 % (ref 0–5)
Lymphocytes Relative: 19 % (ref 12–46)
Lymphs Abs: 0.5 10*3/uL — ABNORMAL LOW (ref 0.7–4.0)
Monocytes Absolute: 0.5 10*3/uL (ref 0.1–1.0)
Monocytes Absolute: 0.7 10*3/uL (ref 0.1–1.0)
Monocytes Relative: 2 % — ABNORMAL LOW (ref 3–12)
Neutro Abs: 22.7 10*3/uL — ABNORMAL HIGH (ref 1.7–7.7)
Neutro Abs: 18 10*3/uL — ABNORMAL HIGH (ref 1.7–7.7)
Neutrophils Relative %: 85 % — ABNORMAL HIGH (ref 43–77)

## 2010-05-12 LAB — DIGOXIN LEVEL: Digoxin Level: 1.1 ng/mL (ref 0.8–2.0)

## 2010-05-12 LAB — POCT CARDIAC MARKERS
CKMB, poc: 1.6 ng/mL (ref 1.0–8.0)
CKMB, poc: 1.6 ng/mL (ref 1.0–8.0)
Troponin i, poc: <0.05 ng/mL (ref 0.00–0.09)

## 2010-05-12 LAB — BLOOD GAS, ARTERIAL
Acid-Base Excess: 7.3 mmol/L — ABNORMAL HIGH (ref 0.0–2.0)
Inspiratory PAP: 12
pCO2 arterial: 57 mmHg — ABNORMAL HIGH (ref 35.0–45.0)
pO2, Arterial: 117 mmHg — ABNORMAL HIGH (ref 80.0–100.0)

## 2010-05-12 LAB — COMPREHENSIVE METABOLIC PANEL
ALT: 20 U/L (ref 0–35)
AST: 24 U/L (ref 0–37)
Albumin: 3.2 g/dL — ABNORMAL LOW (ref 3.5–5.2)
Alkaline Phosphatase: 61 U/L (ref 39–117)
Chloride: 96 mEq/L (ref 96–112)
GFR calc Af Amer: >60 mL/min (ref >60)
Potassium: 4.4 mEq/L (ref 3.5–5.1)
Sodium: 138 mEq/L (ref 135–145)
Total Bilirubin: 0.4 mg/dL (ref 0.3–1.2)

## 2010-05-12 LAB — PROTIME-INR
INR: 2.48 — ABNORMAL HIGH (ref 0.00–1.49)
Prothrombin Time: 27.3 seconds — ABNORMAL HIGH (ref 11.6–15.2)
Prothrombin Time: 26.7 seconds — ABNORMAL HIGH (ref 11.6–15.2)

## 2010-05-12 LAB — TSH: TSH: 0.504 u[IU]/mL (ref 0.350–4.500)

## 2010-05-12 LAB — BRAIN NATRIURETIC PEPTIDE: Pro B Natriuretic peptide (BNP): 57.9 pg/mL (ref 0.0–100.0)

## 2010-05-14 LAB — CBC
HCT: 33.4 % — ABNORMAL LOW (ref 36.0–46.0)
MCV: 81.4 fL (ref 78.0–100.0)
Platelets: 210 10*3/uL (ref 150–400)
RDW: 17.9 % — ABNORMAL HIGH (ref 11.5–15.5)

## 2010-05-14 LAB — BRAIN NATRIURETIC PEPTIDE: Pro B Natriuretic peptide (BNP): 47.9 pg/mL (ref 0.0–100.0)

## 2010-05-14 LAB — POCT CARDIAC MARKERS: Troponin i, poc: <0.05 ng/mL (ref 0.00–0.09)

## 2010-05-14 LAB — BASIC METABOLIC PANEL
BUN: 10 mg/dL (ref 6–23)
CO2: 30 mEq/L (ref 19–32)
Chloride: 98 mEq/L (ref 96–112)
Glucose, Bld: 133 mg/dL — ABNORMAL HIGH (ref 70–99)
Potassium: 4.2 mEq/L (ref 3.5–5.1)

## 2010-05-14 LAB — URINALYSIS, ROUTINE W REFLEX MICROSCOPIC
Glucose, UA: NEGATIVE mg/dL
Ketones, ur: NEGATIVE mg/dL
Protein, ur: NEGATIVE mg/dL

## 2010-05-14 LAB — DIFFERENTIAL
Basophils Absolute: 0 10*3/uL (ref 0.0–0.1)
Eosinophils Absolute: 0 10*3/uL (ref 0.0–0.7)
Eosinophils Relative: 0 % (ref 0–5)
Lymphs Abs: 1.5 10*3/uL (ref 0.7–4.0)

## 2010-05-14 LAB — GLUCOSE, CAPILLARY: Glucose-Capillary: 119 mg/dL — ABNORMAL HIGH (ref 70–99)

## 2010-05-15 LAB — CBC
HCT: 24.3 % — ABNORMAL LOW (ref 36.0–46.0)
HCT: 25.9 % — ABNORMAL LOW (ref 36.0–46.0)
HCT: 26.6 % — ABNORMAL LOW (ref 36.0–46.0)
HCT: 27.5 % — ABNORMAL LOW (ref 36.0–46.0)
HCT: 34.1 % — ABNORMAL LOW (ref 36.0–46.0)
Hemoglobin: 10 g/dL — ABNORMAL LOW (ref 12.0–15.0)
Hemoglobin: 10.7 g/dL — ABNORMAL LOW (ref 12.0–15.0)
Hemoglobin: 7.8 g/dL — ABNORMAL LOW (ref 12.0–15.0)
Hemoglobin: 8.3 g/dL — ABNORMAL LOW (ref 12.0–15.0)
Hemoglobin: 8.7 g/dL — ABNORMAL LOW (ref 12.0–15.0)
Hemoglobin: 8.7 g/dL — ABNORMAL LOW (ref 12.0–15.0)
MCHC: 31.1 g/dL (ref 30.0–36.0)
MCHC: 31.8 g/dL (ref 30.0–36.0)
MCHC: 31.9 g/dL (ref 30.0–36.0)
MCV: 76.2 fL — ABNORMAL LOW (ref 78.0–100.0)
Platelets: 296 10*3/uL (ref 150–400)
Platelets: 310 10*3/uL (ref 150–400)
Platelets: 455 10*3/uL — ABNORMAL HIGH (ref 150–400)
RBC: 3.7 MIL/uL — ABNORMAL LOW (ref 3.87–5.11)
RBC: 3.75 MIL/uL — ABNORMAL LOW (ref 3.87–5.11)
RBC: 4.31 MIL/uL (ref 3.87–5.11)
RDW: 21.3 % — ABNORMAL HIGH (ref 11.5–15.5)
RDW: 22.3 % — ABNORMAL HIGH (ref 11.5–15.5)
RDW: 22.4 % — ABNORMAL HIGH (ref 11.5–15.5)
RDW: 23.5 % — ABNORMAL HIGH (ref 11.5–15.5)
RDW: 28.5 % — ABNORMAL HIGH (ref 11.5–15.5)
WBC: 13.4 10*3/uL — ABNORMAL HIGH (ref 4.0–10.5)
WBC: 15.9 10*3/uL — ABNORMAL HIGH (ref 4.0–10.5)
WBC: 18.9 10*3/uL — ABNORMAL HIGH (ref 4.0–10.5)
WBC: 20.7 10*3/uL — ABNORMAL HIGH (ref 4.0–10.5)

## 2010-05-15 LAB — CULTURE, BLOOD (ROUTINE X 2): Report Status: 2062011

## 2010-05-15 LAB — BASIC METABOLIC PANEL
BUN: 13 mg/dL (ref 6–23)
CO2: 27 mEq/L (ref 19–32)
CO2: 32 mEq/L (ref 19–32)
Calcium: 8.9 mg/dL (ref 8.4–10.5)
GFR calc Af Amer: >60 mL/min (ref >60)
GFR calc non Af Amer: >60 mL/min (ref >60)
GFR calc non Af Amer: >60 mL/min (ref >60)
Glucose, Bld: 116 mg/dL — ABNORMAL HIGH (ref 70–99)
Glucose, Bld: 161 mg/dL — ABNORMAL HIGH (ref 70–99)
Potassium: 3.8 mEq/L (ref 3.5–5.1)
Potassium: 4.6 mEq/L (ref 3.5–5.1)
Sodium: 138 mEq/L (ref 135–145)

## 2010-05-15 LAB — PROTIME-INR
INR: 1.18 (ref 0.00–1.49)
INR: 1.18 (ref 0.00–1.49)
INR: 1.76 — ABNORMAL HIGH (ref 0.00–1.49)
INR: 2.81 — ABNORMAL HIGH (ref 0.00–1.49)
Prothrombin Time: 14.6 seconds (ref 11.6–15.2)
Prothrombin Time: 14.9 seconds (ref 11.6–15.2)
Prothrombin Time: 14.9 seconds (ref 11.6–15.2)
Prothrombin Time: 16 seconds — ABNORMAL HIGH (ref 11.6–15.2)
Prothrombin Time: 20.4 seconds — ABNORMAL HIGH (ref 11.6–15.2)
Prothrombin Time: 26.6 seconds — ABNORMAL HIGH (ref 11.6–15.2)
Prothrombin Time: 29.4 seconds — ABNORMAL HIGH (ref 11.6–15.2)

## 2010-05-15 LAB — TYPE AND SCREEN
ABO/RH(D): B POS
Antibody Screen: NEGATIVE

## 2010-05-15 LAB — COMPREHENSIVE METABOLIC PANEL
ALT: 35 U/L (ref 0–35)
AST: 37 U/L (ref 0–37)
Albumin: 3.5 g/dL (ref 3.5–5.2)
Alkaline Phosphatase: 54 U/L (ref 39–117)
GFR calc Af Amer: >60 mL/min (ref >60)
Glucose, Bld: 100 mg/dL — ABNORMAL HIGH (ref 70–99)
Potassium: 3.6 mEq/L (ref 3.5–5.1)
Sodium: 140 mEq/L (ref 135–145)
Total Protein: 6.5 g/dL (ref 6.0–8.3)

## 2010-05-15 LAB — DIFFERENTIAL
Basophils Absolute: 0 10*3/uL (ref 0.0–0.1)
Basophils Absolute: 0 10*3/uL (ref 0.0–0.1)
Basophils Relative: 0 % (ref 0–1)
Basophils Relative: 0 % (ref 0–1)
Basophils Relative: 0 % (ref 0–1)
Basophils Relative: 0 % (ref 0–1)
Eosinophils Absolute: 0 10*3/uL (ref 0.0–0.7)
Eosinophils Absolute: 0 10*3/uL (ref 0.0–0.7)
Eosinophils Absolute: 0 10*3/uL (ref 0.0–0.7)
Eosinophils Relative: 0 % (ref 0–5)
Eosinophils Relative: 0 % (ref 0–5)
Eosinophils Relative: 0 % (ref 0–5)
Eosinophils Relative: 0 % (ref 0–5)
Eosinophils Relative: 0 % (ref 0–5)
Lymphocytes Relative: 12 % (ref 12–46)
Lymphocytes Relative: 12 % (ref 12–46)
Lymphocytes Relative: 14 % (ref 12–46)
Lymphocytes Relative: 3 % — ABNORMAL LOW (ref 12–46)
Lymphs Abs: 1.9 10*3/uL (ref 0.7–4.0)
Lymphs Abs: 2.5 10*3/uL (ref 0.7–4.0)
Lymphs Abs: 2.5 10*3/uL (ref 0.7–4.0)
Lymphs Abs: 2.5 10*3/uL (ref 0.7–4.0)
Lymphs Abs: 4.3 10*3/uL — ABNORMAL HIGH (ref 0.7–4.0)
Monocytes Absolute: 0.2 10*3/uL (ref 0.1–1.0)
Monocytes Absolute: 0.2 10*3/uL (ref 0.1–1.0)
Monocytes Absolute: 1 10*3/uL (ref 0.1–1.0)
Monocytes Absolute: 1.2 10*3/uL — ABNORMAL HIGH (ref 0.1–1.0)
Monocytes Absolute: 1.2 10*3/uL — ABNORMAL HIGH (ref 0.1–1.0)
Monocytes Absolute: 1.3 10*3/uL — ABNORMAL HIGH (ref 0.1–1.0)
Monocytes Relative: 1 % — ABNORMAL LOW (ref 3–12)
Monocytes Relative: 1 % — ABNORMAL LOW (ref 3–12)
Monocytes Relative: 6 % (ref 3–12)
Monocytes Relative: 6 % (ref 3–12)
Neutro Abs: 10.4 10*3/uL — ABNORMAL HIGH (ref 1.7–7.7)
Neutro Abs: 14.1 10*3/uL — ABNORMAL HIGH (ref 1.7–7.7)
Neutro Abs: 15.1 10*3/uL — ABNORMAL HIGH (ref 1.7–7.7)
Neutro Abs: 16.8 10*3/uL — ABNORMAL HIGH (ref 1.7–7.7)
Neutrophils Relative %: 78 % — ABNORMAL HIGH (ref 43–77)

## 2010-05-15 LAB — CARDIAC PANEL(CRET KIN+CKTOT+MB+TROPI)
Relative Index: INVALID (ref 0.0–2.5)
Relative Index: INVALID (ref 0.0–2.5)
Total CK: 23 U/L (ref 7–177)
Total CK: 27 U/L (ref 7–177)
Troponin I: 0.04 ng/mL (ref 0.00–0.06)

## 2010-05-15 LAB — URINALYSIS, ROUTINE W REFLEX MICROSCOPIC
Bilirubin Urine: NEGATIVE
Nitrite: NEGATIVE
Specific Gravity, Urine: 1.02 (ref 1.005–1.030)
pH: 6.5 (ref 5.0–8.0)

## 2010-05-15 LAB — URINE MICROSCOPIC-ADD ON

## 2010-05-16 LAB — GLUCOSE, CAPILLARY
Glucose-Capillary: 105 mg/dL — ABNORMAL HIGH (ref 70–99)
Glucose-Capillary: 158 mg/dL — ABNORMAL HIGH (ref 70–99)

## 2010-05-19 LAB — URINE CULTURE
Colony Count: NO GROWTH
Culture: NO GROWTH
Special Requests: NEGATIVE

## 2010-05-19 LAB — DIFFERENTIAL
Basophils Absolute: 0 10*3/uL (ref 0.0–0.1)
Basophils Absolute: 0 10*3/uL (ref 0.0–0.1)
Basophils Relative: 0 % (ref 0–1)
Basophils Relative: 0 % (ref 0–1)
Blasts: 1 %
Eosinophils Absolute: 0 10*3/uL (ref 0.0–0.7)
Eosinophils Absolute: 0 10*3/uL (ref 0.0–0.7)
Eosinophils Absolute: 0 10*3/uL (ref 0.0–0.7)
Eosinophils Relative: 0 % (ref 0–5)
Eosinophils Relative: 0 % (ref 0–5)
Lymphocytes Relative: 17 % (ref 12–46)
Lymphocytes Relative: 7 % — ABNORMAL LOW (ref 12–46)
Lymphs Abs: 1.3 10*3/uL (ref 0.7–4.0)
Lymphs Abs: 2.1 10*3/uL (ref 0.7–4.0)
Metamyelocytes Relative: 0 %
Monocytes Absolute: 0.5 10*3/uL (ref 0.1–1.0)
Monocytes Absolute: 1.1 10*3/uL — ABNORMAL HIGH (ref 0.1–1.0)
Monocytes Absolute: 1.2 10*3/uL — ABNORMAL HIGH (ref 0.1–1.0)
Monocytes Absolute: 1.7 10*3/uL — ABNORMAL HIGH (ref 0.1–1.0)
Monocytes Relative: 4 % (ref 3–12)
Monocytes Relative: 7 % (ref 3–12)
Monocytes Relative: 7 % (ref 3–12)
Neutro Abs: 19.4 10*3/uL — ABNORMAL HIGH (ref 1.7–7.7)
Neutro Abs: 20.3 10*3/uL — ABNORMAL HIGH (ref 1.7–7.7)
Neutrophils Relative %: 77 % (ref 43–77)
Neutrophils Relative %: 85 % — ABNORMAL HIGH (ref 43–77)
nRBC: 0 /100 WBC

## 2010-05-19 LAB — CBC
HCT: 29.7 % — ABNORMAL LOW (ref 36.0–46.0)
HCT: 30.9 % — ABNORMAL LOW (ref 36.0–46.0)
HCT: 31.6 % — ABNORMAL LOW (ref 36.0–46.0)
Hemoglobin: 10.4 g/dL — ABNORMAL LOW (ref 12.0–15.0)
Hemoglobin: 10.5 g/dL — ABNORMAL LOW (ref 12.0–15.0)
Hemoglobin: 11.5 g/dL — ABNORMAL LOW (ref 12.0–15.0)
Hemoglobin: 9.8 g/dL — ABNORMAL LOW (ref 12.0–15.0)
MCHC: 33.2 g/dL (ref 30.0–36.0)
MCHC: 33.6 g/dL (ref 30.0–36.0)
MCV: 80.3 fL (ref 78.0–100.0)
MCV: 80.3 fL (ref 78.0–100.0)
MCV: 80.6 fL (ref 78.0–100.0)
MCV: 81 fL (ref 78.0–100.0)
Platelets: 226 10*3/uL (ref 150–400)
Platelets: 251 10*3/uL (ref 150–400)
Platelets: 297 10*3/uL (ref 150–400)
Platelets: 358 10*3/uL (ref 150–400)
RBC: 3.94 MIL/uL (ref 3.87–5.11)
RBC: 4.4 MIL/uL (ref 3.87–5.11)
RDW: 23.3 % — ABNORMAL HIGH (ref 11.5–15.5)
RDW: 23.4 % — ABNORMAL HIGH (ref 11.5–15.5)
RDW: 24.3 % — ABNORMAL HIGH (ref 11.5–15.5)
WBC: 12.4 10*3/uL — ABNORMAL HIGH (ref 4.0–10.5)
WBC: 15.8 10*3/uL — ABNORMAL HIGH (ref 4.0–10.5)
WBC: 22.8 10*3/uL — ABNORMAL HIGH (ref 4.0–10.5)
WBC: 24.6 10*3/uL — ABNORMAL HIGH (ref 4.0–10.5)

## 2010-05-19 LAB — BASIC METABOLIC PANEL
BUN: 10 mg/dL (ref 6–23)
BUN: 17 mg/dL (ref 6–23)
CO2: 33 mEq/L — ABNORMAL HIGH (ref 19–32)
CO2: 35 mEq/L — ABNORMAL HIGH (ref 19–32)
Calcium: 8.8 mg/dL (ref 8.4–10.5)
Calcium: 9 mg/dL (ref 8.4–10.5)
Chloride: 96 mEq/L (ref 96–112)
Creatinine, Ser: 0.59 mg/dL (ref 0.4–1.2)
Creatinine, Ser: 1.02 mg/dL (ref 0.4–1.2)
GFR calc Af Amer: >60 mL/min (ref >60)
GFR calc Af Amer: >60 mL/min (ref >60)
GFR calc Af Amer: >60 mL/min (ref >60)
GFR calc non Af Amer: 53 mL/min — ABNORMAL LOW (ref >60)
GFR calc non Af Amer: >60 mL/min (ref >60)
Glucose, Bld: 158 mg/dL — ABNORMAL HIGH (ref 70–99)
Glucose, Bld: 196 mg/dL — ABNORMAL HIGH (ref 70–99)
Potassium: 3.1 mEq/L — ABNORMAL LOW (ref 3.5–5.1)
Potassium: 4.2 mEq/L (ref 3.5–5.1)
Sodium: 135 mEq/L (ref 135–145)
Sodium: 139 mEq/L (ref 135–145)
Sodium: 142 mEq/L (ref 135–145)

## 2010-05-19 LAB — PROTIME-INR
INR: 1.08 (ref 0.00–1.49)
Prothrombin Time: 13.9 seconds (ref 11.6–15.2)

## 2010-05-19 LAB — STOOL CULTURE

## 2010-05-19 LAB — VANCOMYCIN, TROUGH: Vancomycin Tr: 11.8 ug/mL (ref 10.0–20.0)

## 2010-05-19 LAB — BRAIN NATRIURETIC PEPTIDE: Pro B Natriuretic peptide (BNP): 135 pg/mL — ABNORMAL HIGH (ref 0.0–100.0)

## 2010-05-19 LAB — CLOSTRIDIUM DIFFICILE EIA: C difficile Toxins A+B, EIA: NEGATIVE

## 2010-05-19 LAB — HEPATIC FUNCTION PANEL
ALT: 22 U/L (ref 0–35)
AST: 25 U/L (ref 0–37)
Albumin: 3.8 g/dL (ref 3.5–5.2)
Total Bilirubin: 0.4 mg/dL (ref 0.3–1.2)

## 2010-05-19 LAB — MRSA PCR SCREENING: MRSA by PCR: POSITIVE — AB

## 2010-05-19 LAB — CULTURE, BLOOD (ROUTINE X 2): Report Status: 3192011

## 2010-05-19 LAB — HEPARIN LEVEL (UNFRACTIONATED): Heparin Unfractionated: 0.16 IU/mL — ABNORMAL LOW (ref 0.30–0.70)

## 2010-05-19 LAB — URINALYSIS, ROUTINE W REFLEX MICROSCOPIC
Bilirubin Urine: NEGATIVE
Hgb urine dipstick: NEGATIVE
Ketones, ur: NEGATIVE mg/dL
Nitrite: NEGATIVE
Specific Gravity, Urine: 1.025 (ref 1.005–1.030)
pH: 5 (ref 5.0–8.0)

## 2010-05-19 LAB — POCT CARDIAC MARKERS: Myoglobin, poc: 109 ng/mL (ref 12–200)

## 2010-05-20 LAB — GLUCOSE, CAPILLARY: Glucose-Capillary: 135 mg/dL — ABNORMAL HIGH (ref 70–99)

## 2010-05-21 LAB — GLUCOSE, CAPILLARY
Glucose-Capillary: 93 mg/dL (ref 70–99)
Glucose-Capillary: 143 mg/dL — ABNORMAL HIGH (ref 70–99)

## 2010-05-23 LAB — GLUCOSE, CAPILLARY: Glucose-Capillary: 96 mg/dL (ref 70–99)

## 2010-05-27 LAB — BLOOD GAS, ARTERIAL
Acid-Base Excess: 13.9 mmol/L — ABNORMAL HIGH (ref 0.0–2.0)
Acid-Base Excess: 15.8 mmol/L — ABNORMAL HIGH (ref 0.0–2.0)
Acid-Base Excess: 2.8 mmol/L — ABNORMAL HIGH (ref 0.0–2.0)
Acid-Base Excess: 4.1 mmol/L — ABNORMAL HIGH (ref 0.0–2.0)
Acid-Base Excess: 5 mmol/L — ABNORMAL HIGH (ref 0.0–2.0)
Acid-Base Excess: 6 mmol/L — ABNORMAL HIGH (ref 0.0–2.0)
Acid-Base Excess: 8.7 mmol/L — ABNORMAL HIGH (ref 0.0–2.0)
Bicarbonate: 28 mEq/L — ABNORMAL HIGH (ref 20.0–24.0)
Bicarbonate: 28 mEq/L — ABNORMAL HIGH (ref 20.0–24.0)
Bicarbonate: 29.1 mEq/L — ABNORMAL HIGH (ref 20.0–24.0)
Bicarbonate: 31.8 mEq/L — ABNORMAL HIGH (ref 20.0–24.0)
Bicarbonate: 34 mEq/L — ABNORMAL HIGH (ref 20.0–24.0)
Bicarbonate: 35.1 mEq/L — ABNORMAL HIGH (ref 20.0–24.0)
Bicarbonate: 38.7 mEq/L — ABNORMAL HIGH (ref 20.0–24.0)
Bicarbonate: 42.1 mEq/L — ABNORMAL HIGH (ref 20.0–24.0)
Delivery systems: POSITIVE
Delivery systems: POSITIVE
Expiratory PAP: 9
Expiratory PAP: 9
Inspiratory PAP: 18
O2 Content: 3 L/min
O2 Content: 3 L/min
O2 Content: 4 L/min
O2 Content: 60 L/min
O2 Saturation: 91.2 %
O2 Saturation: 96.4 %
O2 Saturation: 96.6 %
O2 Saturation: 97.5 %
O2 Saturation: 97.6 %
O2 Saturation: 97.9 %
Patient temperature: 37
Patient temperature: 37
Patient temperature: 37
Patient temperature: 37
Patient temperature: 37
Patient temperature: 37
TCO2: 26.6 mmol/L (ref 0–100)
TCO2: 26.7 mmol/L (ref 0–100)
TCO2: 27.5 mmol/L (ref 0–100)
TCO2: 31 mmol/L (ref 0–100)
TCO2: 32.2 mmol/L (ref 0–100)
TCO2: 32.6 mmol/L (ref 0–100)
TCO2: 36.9 mmol/L (ref 0–100)
TCO2: 37 mmol/L (ref 0–100)
TCO2: 38.5 mmol/L (ref 0–100)
pCO2 arterial: 51.8 mmHg — ABNORMAL HIGH (ref 35.0–45.0)
pCO2 arterial: 52.7 mmHg — ABNORMAL HIGH (ref 35.0–45.0)
pCO2 arterial: 69.1 mmHg (ref 35.0–45.0)
pCO2 arterial: 72.6 mmHg (ref 35.0–45.0)
pCO2 arterial: 73.8 mmHg (ref 35.0–45.0)
pCO2 arterial: 74.7 mmHg (ref 35.0–45.0)
pCO2 arterial: 84.9 mmHg (ref 35.0–45.0)
pH, Arterial: 7.224 — ABNORMAL LOW (ref 7.350–7.400)
pH, Arterial: 7.252 — ABNORMAL LOW (ref 7.350–7.400)
pH, Arterial: 7.286 — ABNORMAL LOW (ref 7.350–7.400)
pH, Arterial: 7.292 — ABNORMAL LOW (ref 7.350–7.400)
pH, Arterial: 7.346 — ABNORMAL LOW (ref 7.350–7.400)
pH, Arterial: 7.369 (ref 7.350–7.400)
pO2, Arterial: 103 mmHg — ABNORMAL HIGH (ref 80.0–100.0)
pO2, Arterial: 112 mmHg — ABNORMAL HIGH (ref 80.0–100.0)
pO2, Arterial: 137 mmHg — ABNORMAL HIGH (ref 80.0–100.0)
pO2, Arterial: 61.2 mmHg — ABNORMAL LOW (ref 80.0–100.0)
pO2, Arterial: 93.6 mmHg (ref 80.0–100.0)
pO2, Arterial: 96.3 mmHg (ref 80.0–100.0)

## 2010-05-27 LAB — URINE CULTURE
Colony Count: NO GROWTH
Culture: NO GROWTH

## 2010-05-27 LAB — IRON AND TIBC: Saturation Ratios: 37 % (ref 20–55)

## 2010-05-27 LAB — COMPREHENSIVE METABOLIC PANEL
ALT: 124 U/L — ABNORMAL HIGH (ref 0–35)
ALT: 81 U/L — ABNORMAL HIGH (ref 0–35)
AST: 35 U/L (ref 0–37)
Albumin: 4 g/dL (ref 3.5–5.2)
Alkaline Phosphatase: 37 U/L — ABNORMAL LOW (ref 39–117)
Alkaline Phosphatase: 37 U/L — ABNORMAL LOW (ref 39–117)
BUN: 38 mg/dL — ABNORMAL HIGH (ref 6–23)
CO2: 38 mEq/L — ABNORMAL HIGH (ref 19–32)
Calcium: 8.6 mg/dL (ref 8.4–10.5)
Chloride: 101 mEq/L (ref 96–112)
GFR calc Af Amer: >60 mL/min (ref >60)
GFR calc non Af Amer: >60 mL/min (ref >60)
Glucose, Bld: 170 mg/dL — ABNORMAL HIGH (ref 70–99)
Potassium: 3.9 mEq/L (ref 3.5–5.1)
Potassium: 4.2 mEq/L (ref 3.5–5.1)
Sodium: 140 mEq/L (ref 135–145)
Sodium: 143 mEq/L (ref 135–145)
Total Bilirubin: 0.6 mg/dL (ref 0.3–1.2)

## 2010-05-27 LAB — CBC
HCT: 27.9 % — ABNORMAL LOW (ref 36.0–46.0)
HCT: 29.7 % — ABNORMAL LOW (ref 36.0–46.0)
HCT: 31 % — ABNORMAL LOW (ref 36.0–46.0)
HCT: 31.2 % — ABNORMAL LOW (ref 36.0–46.0)
HCT: 32.3 % — ABNORMAL LOW (ref 36.0–46.0)
Hemoglobin: 10 g/dL — ABNORMAL LOW (ref 12.0–15.0)
Hemoglobin: 8.8 g/dL — ABNORMAL LOW (ref 12.0–15.0)
Hemoglobin: 9.3 g/dL — ABNORMAL LOW (ref 12.0–15.0)
Hemoglobin: 9.4 g/dL — ABNORMAL LOW (ref 12.0–15.0)
Hemoglobin: 9.5 g/dL — ABNORMAL LOW (ref 12.0–15.0)
Hemoglobin: 9.6 g/dL — ABNORMAL LOW (ref 12.0–15.0)
Hemoglobin: 9.7 g/dL — ABNORMAL LOW (ref 12.0–15.0)
Hemoglobin: 9.7 g/dL — ABNORMAL LOW (ref 12.0–15.0)
MCHC: 31 g/dL (ref 30.0–36.0)
MCHC: 31.1 g/dL (ref 30.0–36.0)
MCHC: 31.1 g/dL (ref 30.0–36.0)
MCHC: 31.2 g/dL (ref 30.0–36.0)
MCHC: 31.2 g/dL (ref 30.0–36.0)
MCHC: 31.3 g/dL (ref 30.0–36.0)
MCHC: 31.7 g/dL (ref 30.0–36.0)
MCV: 74.1 fL — ABNORMAL LOW (ref 78.0–100.0)
MCV: 74.7 fL — ABNORMAL LOW (ref 78.0–100.0)
MCV: 76.1 fL — ABNORMAL LOW (ref 78.0–100.0)
MCV: 76.6 fL — ABNORMAL LOW (ref 78.0–100.0)
Platelets: 215 10*3/uL (ref 150–400)
Platelets: 321 10*3/uL (ref 150–400)
RBC: 3.7 MIL/uL — ABNORMAL LOW (ref 3.87–5.11)
RBC: 3.86 MIL/uL — ABNORMAL LOW (ref 3.87–5.11)
RBC: 4.04 MIL/uL (ref 3.87–5.11)
RBC: 4.07 MIL/uL (ref 3.87–5.11)
RBC: 4.09 MIL/uL (ref 3.87–5.11)
RBC: 4.16 MIL/uL (ref 3.87–5.11)
RBC: 4.36 MIL/uL (ref 3.87–5.11)
RDW: 17.8 % — ABNORMAL HIGH (ref 11.5–15.5)
RDW: 18.1 % — ABNORMAL HIGH (ref 11.5–15.5)
RDW: 18.1 % — ABNORMAL HIGH (ref 11.5–15.5)
RDW: 18.2 % — ABNORMAL HIGH (ref 11.5–15.5)
WBC: 13.3 10*3/uL — ABNORMAL HIGH (ref 4.0–10.5)
WBC: 20.8 10*3/uL — ABNORMAL HIGH (ref 4.0–10.5)

## 2010-05-27 LAB — BASIC METABOLIC PANEL
CO2: 30 mEq/L (ref 19–32)
CO2: 32 mEq/L (ref 19–32)
Calcium: 8.8 mg/dL (ref 8.4–10.5)
Chloride: 100 mEq/L (ref 96–112)
Chloride: 102 mEq/L (ref 96–112)
Creatinine, Ser: 0.62 mg/dL (ref 0.4–1.2)
Creatinine, Ser: 0.62 mg/dL (ref 0.4–1.2)
GFR calc Af Amer: >60 mL/min (ref >60)
GFR calc Af Amer: >60 mL/min (ref >60)
GFR calc Af Amer: >60 mL/min (ref >60)
GFR calc non Af Amer: >60 mL/min (ref >60)
Glucose, Bld: 173 mg/dL — ABNORMAL HIGH (ref 70–99)
Glucose, Bld: 187 mg/dL — ABNORMAL HIGH (ref 70–99)
Potassium: 4 mEq/L (ref 3.5–5.1)
Sodium: 133 mEq/L — ABNORMAL LOW (ref 135–145)
Sodium: 139 mEq/L (ref 135–145)

## 2010-05-27 LAB — DIFFERENTIAL
Basophils Absolute: 0 10*3/uL (ref 0.0–0.1)
Basophils Absolute: 0 10*3/uL (ref 0.0–0.1)
Basophils Absolute: 0 10*3/uL (ref 0.0–0.1)
Basophils Absolute: 0 10*3/uL (ref 0.0–0.1)
Basophils Absolute: 0 10*3/uL (ref 0.0–0.1)
Basophils Relative: 0 % (ref 0–1)
Basophils Relative: 0 % (ref 0–1)
Basophils Relative: 0 % (ref 0–1)
Basophils Relative: 0 % (ref 0–1)
Basophils Relative: 0 % (ref 0–1)
Basophils Relative: 0 % (ref 0–1)
Basophils Relative: 0 % (ref 0–1)
Basophils Relative: 0 % (ref 0–1)
Eosinophils Absolute: 0 10*3/uL (ref 0.0–0.7)
Eosinophils Absolute: 0 10*3/uL (ref 0.0–0.7)
Eosinophils Absolute: 0 10*3/uL (ref 0.0–0.7)
Eosinophils Absolute: 0 10*3/uL (ref 0.0–0.7)
Eosinophils Absolute: 0 10*3/uL (ref 0.0–0.7)
Eosinophils Relative: 0 % (ref 0–5)
Eosinophils Relative: 0 % (ref 0–5)
Eosinophils Relative: 0 % (ref 0–5)
Eosinophils Relative: 0 % (ref 0–5)
Eosinophils Relative: 0 % (ref 0–5)
Lymphocytes Relative: 3 % — ABNORMAL LOW (ref 12–46)
Lymphocytes Relative: 7 % — ABNORMAL LOW (ref 12–46)
Lymphs Abs: 0.5 10*3/uL — ABNORMAL LOW (ref 0.7–4.0)
Lymphs Abs: 0.7 10*3/uL (ref 0.7–4.0)
Monocytes Absolute: 0.2 10*3/uL (ref 0.1–1.0)
Monocytes Absolute: 0.3 10*3/uL (ref 0.1–1.0)
Monocytes Absolute: 0.4 10*3/uL (ref 0.1–1.0)
Monocytes Absolute: 0.4 10*3/uL (ref 0.1–1.0)
Monocytes Absolute: 0.5 10*3/uL (ref 0.1–1.0)
Monocytes Absolute: 0.6 10*3/uL (ref 0.1–1.0)
Monocytes Absolute: 0.6 10*3/uL (ref 0.1–1.0)
Monocytes Absolute: 0.8 10*3/uL (ref 0.1–1.0)
Monocytes Relative: 2 % — ABNORMAL LOW (ref 3–12)
Monocytes Relative: 2 % — ABNORMAL LOW (ref 3–12)
Monocytes Relative: 2 % — ABNORMAL LOW (ref 3–12)
Monocytes Relative: 3 % (ref 3–12)
Monocytes Relative: 3 % (ref 3–12)
Monocytes Relative: 3 % (ref 3–12)
Monocytes Relative: 4 % (ref 3–12)
Neutro Abs: 10.9 10*3/uL — ABNORMAL HIGH (ref 1.7–7.7)
Neutro Abs: 12.2 10*3/uL — ABNORMAL HIGH (ref 1.7–7.7)
Neutro Abs: 23.9 10*3/uL — ABNORMAL HIGH (ref 1.7–7.7)
Neutrophils Relative %: 90 % — ABNORMAL HIGH (ref 43–77)
Neutrophils Relative %: 93 % — ABNORMAL HIGH (ref 43–77)
Neutrophils Relative %: 94 % — ABNORMAL HIGH (ref 43–77)

## 2010-05-27 LAB — HEPATIC FUNCTION PANEL
ALT: 53 U/L — ABNORMAL HIGH (ref 0–35)
AST: 40 U/L — ABNORMAL HIGH (ref 0–37)
Alkaline Phosphatase: 43 U/L (ref 39–117)
Total Protein: 7.3 g/dL (ref 6.0–8.3)

## 2010-05-27 LAB — TSH: TSH: 0.129 u[IU]/mL — ABNORMAL LOW (ref 0.350–4.500)

## 2010-05-27 LAB — URINALYSIS, ROUTINE W REFLEX MICROSCOPIC
Bilirubin Urine: NEGATIVE
Glucose, UA: NEGATIVE mg/dL
Hgb urine dipstick: NEGATIVE
Ketones, ur: NEGATIVE mg/dL
pH: 5.5 (ref 5.0–8.0)

## 2010-05-27 LAB — HEPARIN LEVEL (UNFRACTIONATED)
Heparin Unfractionated: 0.36 IU/mL (ref 0.30–0.70)
Heparin Unfractionated: 0.61 IU/mL (ref 0.30–0.70)
Heparin Unfractionated: 0.78 IU/mL — ABNORMAL HIGH (ref 0.30–0.70)

## 2010-05-27 LAB — POCT CARDIAC MARKERS
CKMB, poc: 5.6 ng/mL (ref 1.0–8.0)
Myoglobin, poc: 72.4 ng/mL (ref 12–200)
Troponin i, poc: <0.05 ng/mL (ref 0.00–0.09)

## 2010-05-27 LAB — PROTIME-INR
INR: 1.18 (ref 0.00–1.49)
INR: 1.36 (ref 0.00–1.49)
INR: 1.59 — ABNORMAL HIGH (ref 0.00–1.49)

## 2010-05-27 LAB — BRAIN NATRIURETIC PEPTIDE: Pro B Natriuretic peptide (BNP): 203 pg/mL — ABNORMAL HIGH (ref 0.0–100.0)

## 2010-05-28 ENCOUNTER — Ambulatory Visit (HOSPITAL_COMMUNITY): Payer: No Typology Code available for payment source | Attending: Internal Medicine

## 2010-05-28 DIAGNOSIS — J189 Pneumonia, unspecified organism: Secondary | ICD-10-CM | POA: Insufficient documentation

## 2010-05-28 DIAGNOSIS — R509 Fever, unspecified: Secondary | ICD-10-CM | POA: Insufficient documentation

## 2010-05-28 DIAGNOSIS — R05 Cough: Secondary | ICD-10-CM | POA: Insufficient documentation

## 2010-05-28 DIAGNOSIS — R0602 Shortness of breath: Secondary | ICD-10-CM | POA: Insufficient documentation

## 2010-05-28 DIAGNOSIS — R059 Cough, unspecified: Secondary | ICD-10-CM | POA: Insufficient documentation

## 2010-05-30 LAB — GLUCOSE, CAPILLARY
Glucose-Capillary: 100 mg/dL — ABNORMAL HIGH (ref 70–99)
Glucose-Capillary: 181 mg/dL — ABNORMAL HIGH (ref 70–99)

## 2010-05-30 LAB — BLOOD GAS, ARTERIAL
FIO2: 32 %
O2 Content: 32 L/min
TCO2: 26.3 mmol/L (ref 0–100)
pCO2 arterial: 48 mmHg — ABNORMAL HIGH (ref 35.0–45.0)
pH, Arterial: 7.388 (ref 7.350–7.400)

## 2010-06-06 LAB — GLUCOSE, CAPILLARY: Glucose-Capillary: 167 mg/dL — ABNORMAL HIGH (ref 70–99)

## 2010-06-11 LAB — GLUCOSE, CAPILLARY: Glucose-Capillary: 137 mg/dL — ABNORMAL HIGH (ref 70–99)

## 2010-06-12 LAB — GLUCOSE, CAPILLARY: Glucose-Capillary: 104 mg/dL — ABNORMAL HIGH (ref 70–99)

## 2010-06-13 LAB — GLUCOSE, CAPILLARY
Glucose-Capillary: 100 mg/dL — ABNORMAL HIGH (ref 70–99)
Glucose-Capillary: 123 mg/dL — ABNORMAL HIGH (ref 70–99)

## 2010-06-15 LAB — GLUCOSE, CAPILLARY: Glucose-Capillary: 226 mg/dL — ABNORMAL HIGH (ref 70–99)

## 2010-06-18 LAB — GLUCOSE, CAPILLARY
Glucose-Capillary: 104 mg/dL — ABNORMAL HIGH (ref 70–99)
Glucose-Capillary: 150 mg/dL — ABNORMAL HIGH (ref 70–99)

## 2010-06-20 LAB — GLUCOSE, CAPILLARY: Glucose-Capillary: 146 mg/dL — ABNORMAL HIGH (ref 70–99)

## 2010-06-21 LAB — GLUCOSE, CAPILLARY: Glucose-Capillary: 119 mg/dL — ABNORMAL HIGH (ref 70–99)

## 2010-06-24 LAB — GLUCOSE, CAPILLARY: Glucose-Capillary: 116 mg/dL — ABNORMAL HIGH (ref 70–99)

## 2010-06-25 LAB — GLUCOSE, CAPILLARY: Glucose-Capillary: 99 mg/dL (ref 70–99)

## 2010-07-02 LAB — GLUCOSE, CAPILLARY: Glucose-Capillary: 129 mg/dL — ABNORMAL HIGH (ref 70–99)

## 2010-07-03 ENCOUNTER — Ambulatory Visit (HOSPITAL_COMMUNITY): Payer: No Typology Code available for payment source | Attending: Emergency Medicine

## 2010-07-03 ENCOUNTER — Inpatient Hospital Stay (HOSPITAL_COMMUNITY)
Admission: EM | Admit: 2010-07-03 | Discharge: 2010-07-05 | DRG: 191 | Disposition: A | Discharge status: Skilled Nursing Facility | Payer: No Typology Code available for payment source | Source: Ambulatory Visit | Attending: Otolaryngology | Admitting: Otolaryngology

## 2010-07-03 DIAGNOSIS — R0602 Shortness of breath: Secondary | ICD-10-CM | POA: Insufficient documentation

## 2010-07-03 DIAGNOSIS — F3289 Other specified depressive episodes: Secondary | ICD-10-CM | POA: Diagnosis present

## 2010-07-03 DIAGNOSIS — F411 Generalized anxiety disorder: Secondary | ICD-10-CM | POA: Diagnosis present

## 2010-07-03 DIAGNOSIS — I10 Essential (primary) hypertension: Secondary | ICD-10-CM | POA: Insufficient documentation

## 2010-07-03 DIAGNOSIS — J449 Chronic obstructive pulmonary disease, unspecified: Secondary | ICD-10-CM | POA: Insufficient documentation

## 2010-07-03 DIAGNOSIS — G8929 Other chronic pain: Secondary | ICD-10-CM | POA: Diagnosis present

## 2010-07-03 DIAGNOSIS — K7689 Other specified diseases of liver: Secondary | ICD-10-CM | POA: Diagnosis present

## 2010-07-03 DIAGNOSIS — J9610 Chronic respiratory failure, unspecified whether with hypoxia or hypercapnia: Secondary | ICD-10-CM | POA: Diagnosis present

## 2010-07-03 DIAGNOSIS — K219 Gastro-esophageal reflux disease without esophagitis: Secondary | ICD-10-CM | POA: Diagnosis present

## 2010-07-03 DIAGNOSIS — I4891 Unspecified atrial fibrillation: Secondary | ICD-10-CM | POA: Diagnosis present

## 2010-07-03 DIAGNOSIS — E871 Hypo-osmolality and hyponatremia: Secondary | ICD-10-CM | POA: Diagnosis present

## 2010-07-03 DIAGNOSIS — C3490 Malignant neoplasm of unspecified part of unspecified bronchus or lung: Secondary | ICD-10-CM | POA: Diagnosis present

## 2010-07-03 DIAGNOSIS — F329 Major depressive disorder, single episode, unspecified: Secondary | ICD-10-CM | POA: Diagnosis present

## 2010-07-03 DIAGNOSIS — F0390 Unspecified dementia without behavioral disturbance: Secondary | ICD-10-CM | POA: Diagnosis present

## 2010-07-03 DIAGNOSIS — Z139 Encounter for screening, unspecified: Secondary | ICD-10-CM | POA: Diagnosis present

## 2010-07-03 DIAGNOSIS — Z515 Encounter for palliative care: Secondary | ICD-10-CM

## 2010-07-03 DIAGNOSIS — F17200 Nicotine dependence, unspecified, uncomplicated: Secondary | ICD-10-CM | POA: Insufficient documentation

## 2010-07-03 DIAGNOSIS — E875 Hyperkalemia: Secondary | ICD-10-CM | POA: Diagnosis present

## 2010-07-03 DIAGNOSIS — Z66 Do not resuscitate: Secondary | ICD-10-CM | POA: Diagnosis present

## 2010-07-03 DIAGNOSIS — J441 Chronic obstructive pulmonary disease with (acute) exacerbation: Principal | ICD-10-CM | POA: Diagnosis present

## 2010-07-03 DIAGNOSIS — J4489 Other specified chronic obstructive pulmonary disease: Secondary | ICD-10-CM | POA: Insufficient documentation

## 2010-07-03 HISTORY — DX: Essential (primary) hypertension: I10

## 2010-07-03 LAB — CBC
HCT: 35.5 % — ABNORMAL LOW (ref 36.0–46.0)
Hemoglobin: 10.7 g/dL — ABNORMAL LOW (ref 12.0–15.0)
MCV: 76 fL — ABNORMAL LOW (ref 78.0–100.0)
WBC: 10 10*3/uL (ref 4.0–10.5)

## 2010-07-03 LAB — PROTIME-INR
INR: 2.37 — ABNORMAL HIGH (ref 0.00–1.49)
Prothrombin Time: 26 seconds — ABNORMAL HIGH (ref 11.6–15.2)

## 2010-07-03 LAB — BLOOD GAS, ARTERIAL
Bicarbonate: 27.4 mEq/L — ABNORMAL HIGH (ref 20.0–24.0)
O2 Saturation: 89.6 %
Patient temperature: 37
pH, Arterial: 7.335 — ABNORMAL LOW (ref 7.350–7.400)

## 2010-07-03 LAB — POCT CARDIAC MARKERS
CKMB, poc: 3.6 ng/mL (ref 1.0–8.0)
Troponin i, poc: <0.05 ng/mL (ref 0.00–0.09)

## 2010-07-03 LAB — DIFFERENTIAL
Basophils Absolute: 0 10*3/uL (ref 0.0–0.1)
Eosinophils Relative: 0 % (ref 0–5)
Lymphocytes Relative: 15 % (ref 12–46)
Lymphs Abs: 1.5 10*3/uL (ref 0.7–4.0)
Monocytes Absolute: 0.4 10*3/uL (ref 0.1–1.0)
Neutro Abs: 8.1 10*3/uL — ABNORMAL HIGH (ref 1.7–7.7)

## 2010-07-03 LAB — GLUCOSE, CAPILLARY
Glucose-Capillary: 129 mg/dL — ABNORMAL HIGH (ref 70–99)
Glucose-Capillary: 149 mg/dL — ABNORMAL HIGH (ref 70–99)

## 2010-07-03 LAB — BASIC METABOLIC PANEL
BUN: 29 mg/dL — ABNORMAL HIGH (ref 6–23)
CO2: 32 mEq/L (ref 19–32)
Glucose, Bld: 136 mg/dL — ABNORMAL HIGH (ref 70–99)
Potassium: 5.4 mEq/L — ABNORMAL HIGH (ref 3.5–5.1)
Sodium: 132 mEq/L — ABNORMAL LOW (ref 135–145)

## 2010-07-03 LAB — APTT: aPTT: 46 seconds — ABNORMAL HIGH (ref 24–37)

## 2010-07-04 ENCOUNTER — Encounter (HOSPITAL_COMMUNITY): Payer: Self-pay | Admitting: Radiology

## 2010-07-04 ENCOUNTER — Inpatient Hospital Stay (HOSPITAL_COMMUNITY): Payer: No Typology Code available for payment source

## 2010-07-04 LAB — GLUCOSE, CAPILLARY: Glucose-Capillary: 170 mg/dL — ABNORMAL HIGH (ref 70–99)

## 2010-07-04 LAB — BASIC METABOLIC PANEL
BUN: 24 mg/dL — ABNORMAL HIGH (ref 6–23)
GFR calc non Af Amer: >60 mL/min (ref >60)
Glucose, Bld: 129 mg/dL — ABNORMAL HIGH (ref 70–99)
Potassium: 4.9 mEq/L (ref 3.5–5.1)

## 2010-07-04 LAB — PROTIME-INR: Prothrombin Time: 24.9 seconds — ABNORMAL HIGH (ref 11.6–15.2)

## 2010-07-04 MED ORDER — IOHEXOL 300 MG/ML  SOLN
80.0000 mL | Freq: Once | INTRAMUSCULAR | Status: AC | PRN
  Administered 2010-07-04: 80 mL via INTRAVENOUS

## 2010-07-05 ENCOUNTER — Inpatient Hospital Stay
Admission: RE | Admit: 2010-07-05 | Discharge: 2010-10-21 | Disposition: A | Discharge status: Hospice | Payer: No Typology Code available for payment source | Source: Ambulatory Visit | Attending: Internal Medicine | Admitting: Internal Medicine

## 2010-07-05 LAB — GLUCOSE, CAPILLARY: Glucose-Capillary: 100 mg/dL — ABNORMAL HIGH (ref 70–99)

## 2010-07-11 LAB — GLUCOSE, CAPILLARY: Glucose-Capillary: 154 mg/dL — ABNORMAL HIGH (ref 70–99)

## 2010-07-16 NOTE — H&P (Signed)
NAMESYDNEI, Madison Sanford             ACCOUNT NO.:  [id number]  MEDICAL RECORD NO.:  [id number]           PATIENT TYPE:  I  LOCATION:  A204                          FACILITY:  [facility]  PHYSICIAN:  Jenevie Casstevens L. Lendell Caprice, MDDATE OF BIRTH:  February 19, 1934  DATE OF ADMISSION:  07/03/2010 DATE OF DISCHARGE:  LH                             HISTORY & PHYSICAL   CHIEF COMPLAINT:  Cough and shortness of breath.  HISTORY OF PRESENT ILLNESS:  Madison Sanford is a 77 year old permanent [facility] resident who was sent over with shortness of breath.  She has a history of severe COPD, lung nodule, and chronic hypoxia.  She is usually on 2 L of nasal cannula oxygen 24 hours a day.  She feels better after receiving steroids and bronchodilators.  She is coughing quite a bit.  Her sputum is yellowish.  She has had no fevers, chills, myalgias, rhinorrhea, sore throat.  She has had no chest pain.  PAST MEDICAL HISTORY: 1. Lung nodule.  Apparently a repeat CAT scan was supposed to be done,     but I do not see that this has been done. 2. Severe chronic obstructive pulmonary disease with chronic     respiratory failure. 3. Chronic atrial fibrillation, maintained on Coumadin. 4. History of dementia. 5. Gastroesophageal reflux disease. 6. Depression. 7. Chronic pain. 8. History of anxiety. 9. Fatty liver disease. 10.Hypertension.  MEDICATIONS: 1. Cardizem CD 240 mg a day. 2. Restasis eye drops, one drops to both eyes twice a day. 3. Coumadin 10 mg a day. 4. Primidone 50 mg t.i.d. 5. Oxygen 2-3 L to keep sats above 90. 6. Xopenex 1.25 mg q.6 h. 7. Doxepin 10 mg p.o. 2 tablets nightly. 8. Trazodone 50 mg nightly. 9. Prednisone 10 mg a day. 10.Lasix 80 mg a day. 11.Spiriva inhaled daily. 12.Lanoxin 0.25 mg daily. 13.Lidoderm patch topically daily. 14.Flomax 0.4 mg daily. 15.Zoloft 150 mg a day. 16.Prilosec 20 mg a day. 17.Lisinopril 20 mg a day. 18.OxyContin 20 mg twice a day. 19.Xanax 1 mg p.o.  b.i.d. 20.K-Dur 20 mEq twice a day. 21.Metoprolol 25 mg twice a day. 22.Pulmicort Respules twice a day. 23.Mucinex twice a day. 24.Multiple p.r.n. medications.  No known drug allergies.  SOCIAL HISTORY:  Her code status is do not resuscitate.  She no longer smokes.  She does not drink.  FAMILY HISTORY:  Noncontributory.  SYSTEMS REVIEW:  Denies above, otherwise negative.  PHYSICAL EXAMINATION:  VITAL SIGNS:  Her temperature is 98.9, blood pressure 130/47, pulse 61, respiratory rate 22, oxygen saturation ranges 86% to 93% on supplemental oxygen, currently on 4 L. GENERAL:  The patient is an obese white female in no acute distress. HEENT:  Normocephalic, atraumatic.  Pupils equal, round, reactive to light.  Sclerae nonicteric.  Moist mucous membranes.  Oropharynx is without any evidence of thrush or obstruction. NECK:  Supple.  No lymphadenopathy.  No JVD. LUNGS:  Clear to auscultation bilaterally without wheezes, rhonchi, or rales.  Previously per ED physician, she was wheezing. CARDIOVASCULAR:  Regular rate and rhythm without murmurs, gallops, or rubs. ABDOMEN:  Obese, soft, nontender. GU AND RECTAL:  Deferred. EXTREMITIES:  No clubbing, cyanosis, or edema. SKIN:  She has circular, erythematous area which appears lichenified on her right medial malleolar area.  There is no warmth or tenderness. NEUROLOGIC:  Alert and oriented x3.  Cranial nerves and sensorimotor exam are intact. PSYCHIATRIC:  Normal affect.  LABORATORY DATA:  ABG on 3 L nasal cannula oxygen shows a pH of 7.335, pCO2 of 52, pO2 of 58, bicarbonate 27, 89% saturation.  CBC shows a normal white count, hemoglobin 10.7, hematocrit 35, MCV is 76, otherwise normal CBC.  INR is 2.37.  Sodium 132, potassium 5.4, chloride 95, bicarbonate 32, glucose 136, BUN 29, creatinine 0.82.  BNP and cardiac markers negative.  Chest x-ray shows possible pneumonia versus nodule in the right mid lung.  EKG shows wandering baseline  sinus bradycardia with a rate of 58.  ASSESSMENT AND PLAN: 1. Chronic obstructive pulmonary disease exacerbation with abnormal     chest x-ray:  The patient does have a cough but she has no fever or     leukocytosis.  I will cover for now with Avelox and doxycycline.  I     will check a CT of the chest to see whether this is an infection or     progression of her lung nodule.  Per previous notes, she would not     be a candidate for any aggressive treatment if it were a neoplasm.     Apparently, she declined workup previously, but she wishes to see     whether this nodule has grown at all today.  She has improved based     on description from the ED physician and her own report.  It would     be reasonable to monitor her overnight, however.  I will continue     bronchodilators, oxygen, and increase her prednisone to 60 mg twice     a day. 2. Hyperkalemia.  I will hold her potassium and lisinopril and recheck     in the morning. 3. Paroxysmal atrial fibrillation.  The patient appears in sinus     currently.  Continue Coumadin. 4. Chronic respiratory failure secondary to chronic obstructive     pulmonary disease. 5. Gastroesophageal reflux disease. 6. Hypertension, see above. 7. Anxiety and depression. 8. Fatty liver. 9. History of diastolic dysfunction, no evidence of congestive heart     failure currently. 10.Gastroesophageal reflux disease. 11.Chronic pain. 12.Anxiety. 13.Mild hyponatremia, this will be monitored. 14.Do not resuscitate. 15.Debility.     Macky Galik L. Lendell Caprice, MD     CLS/MEDQ  D:  07/04/2010  T:  07/04/2010  Job:  [id number]  cc:   Maxwell Caul, M.D.  Electronically Signed by Crista Curb MD on 07/16/2010 11:08:36 AM

## 2010-07-16 NOTE — Discharge Summary (Signed)
Madison Sanford, Madison Sanford             ACCOUNT NO.:  [id number]  MEDICAL RECORD NO.:  [id number]           PATIENT TYPE:  I  LOCATION:  A204                          FACILITY:  [facility]  PHYSICIAN:  Aurelio Mccamy L. Lendell Caprice, MDDATE OF BIRTH:  07-Feb-1934  DATE OF ADMISSION:  07/03/2010 DATE OF DISCHARGE:  05/11/2012LH                              DISCHARGE SUMMARY   DISCHARGE DIAGNOSES: 1. Chronic obstructive pulmonary disease exacerbation. 2. Chronic hypoxic respiratory failure. 3. Right lung mass, probable primary lung cancer. 4. Dementia. 5. Severe chronic obstructive pulmonary disease. 6. Chronic atrial fibrillation. 7. Gastroesophageal reflux disease. 8. Depression. 9. Chronic pain. 10.Anxiety. 11.Hypertension. 12.Fatty liver disease. 13.Resolved hyperkalemia. 14.Hyponatremia. 15.Do not resuscitate.  DISCHARGE MEDICATIONS: 1. Wellbutrin XL 150 mg a day. 2. Cardizem CD 240 mg a day. 3. Restasis eyedrops 1 drop to both eyes twice a day. 4. Coumadin 10 mg nightly, adjust to keep INR between 2 and 3. 5. Primidone 50 mg t.i.d. 6. Oxygen 2-3 L nasal cannula to keep sats above 90. 7. Xopenex 1.25 mg handheld nebulizer q.i.d. and q.2 h. P.r.n. 8. Doxepin 20 mg nightly. 9. Atrovent 0.5 mg nebulized q.i.d. 10.Trazodone 50 mg nightly. 11.Prednisone 40 mg a day for 2 days, then decrease by 10 mg p.o. q.2     days until at 10 mg a day and then continue 10 mg a day as     previous. 12.Lasix 80 mg a day. 13.Spiriva 80 mcg inhaled daily. 14.Lanoxin 0.25 mg daily. 15.Lidoderm patch applied topically for 12 hours each day to affected     area. 16.Zoloft 150 mg a day. 17.Prilosec 20 mg a day. 18.Stop lisinopril. 19.Stop Flomax. 20.Stop potassium. 21.OxyContin 20 mg twice a day. 22.Xanax 1 mg p.o. b.i.d. 23.Metoprolol 25 mg p.o. b.i.d. 24.Pulmicort Respules 0.25 mg nebulizer b.i.d. 25.Mucinex XR 1 tablet b.i.d. 26.Xanax 0.5 mg p.o. q.12 h. p.r.n. anxiety. 27.Tylenol 650 mg p.o. q.4 h.  p.r.n. pain or fever. 28.Oxycodone 5 mg p.o. q.6 h. p.r.n. pain.  CONDITION:  Stable.  ACTIVITY:  Fall precautions.  FOLLOWUP:  She will need INR check.  She will need a basic metabolic panel within a few weeks.  She also may be followed by hospice if the patient decides comfort care is appropriate.  CONDITION:  Stable.  CONSULTATIONS:  [facility].  PROCEDURES:  None.  LABORATORY DATA:  ABG on 3 L of oxygen was significant for a pH of 7.335, pCO2 of 52, pO2 87.  CBC significant for hemoglobin of 10.7, hematocrit 35, MCV is 76.  INR on admission was 2.37.  Sodium on admission 132, potassium 5.4, chloride 95, BUN 29 otherwise unremarkable.  At discharge, her sodium is 134, potassium is 4.9, BUN is 24.  BNP and cardiac markers normal.  DIAGNOSTICS DATA:  Chest x-ray showed right-sided infiltrate versus lung mass.  CT of the chest showed spiculated right upper lobe mass measuring 8.1 x 1.6 x 1.4, likely representing primary lung malignancy, increased in size from 2010 CAT scan.  HISTORY AND HOSPITAL COURSE:  Please see H and P for details.  Madison Sanford is a demented 77 year old white female with history of  severe COPD who presented with cough and shortness of breath.  She is on oxygen chronically, but was more hypoxic than usual and her oxygen requirements were greater.  She was wheezing initially.  She had a cough.  She had an abnormal chest x-ray.  She was started initially on antibiotics, but has no evidence of pneumonia.  An chest x-ray was reflecting her increasing lung nodule.  The patient reported that she was tired and "ready to go." She would be hospice eligible due to her severe COPD and presumed lung cancer.  Hospice was consulted and spoke with the patient.  I am not certain whether she has a clear understanding of comfort care due to her dementia.  At this point, she wishes to wait on hospice but seems to indicate that she wishes to be mainly  comfort care.  She wishes to go back to the [facility] and I have spoken with patient's daughter.  I recommend ongoing discussions with the family and the patient to decide whether a palliative approach would be most appropriate.  She did voice the wish to stay at the [facility] and avoid coming back and forth to the hospital.  Her goals of care will need to be clarified and the daughter wishes to talk again with the hospice nurse next week.  I have left a message with them.  The patient is breathing better.  Currently, her electrolyte disturbances are improved and she is stable for discharge back to the nursing home.  Total time on the day of discharge is greater than 30 minutes.     Lyrical Sowle L. Lendell Caprice, MD     CLS/MEDQ  D:  07/05/2010  T:  07/05/2010  Job:  [id number]  cc:   Madison Sanford, M.D.  Electronically Signed by Crista Curb MD on 07/16/2010 11:08:33 AM

## 2010-07-18 LAB — GLUCOSE, CAPILLARY: Glucose-Capillary: 126 mg/dL — ABNORMAL HIGH (ref 70–99)

## 2010-07-23 LAB — GLUCOSE, CAPILLARY: Glucose-Capillary: 107 mg/dL — ABNORMAL HIGH (ref 70–99)

## 2010-07-25 LAB — GLUCOSE, CAPILLARY
Glucose-Capillary: 121 mg/dL — ABNORMAL HIGH (ref 70–99)
Glucose-Capillary: 151 mg/dL — ABNORMAL HIGH (ref 70–99)

## 2010-08-01 LAB — GLUCOSE, CAPILLARY: Glucose-Capillary: 117 mg/dL — ABNORMAL HIGH (ref 70–99)

## 2010-08-06 LAB — GLUCOSE, CAPILLARY: Glucose-Capillary: 112 mg/dL — ABNORMAL HIGH (ref 70–99)

## 2010-08-08 LAB — GLUCOSE, CAPILLARY

## 2010-08-09 LAB — GLUCOSE, CAPILLARY: Glucose-Capillary: 115 mg/dL — ABNORMAL HIGH (ref 70–99)

## 2010-08-13 LAB — GLUCOSE, CAPILLARY: Glucose-Capillary: 106 mg/dL — ABNORMAL HIGH (ref 70–99)

## 2010-08-15 LAB — GLUCOSE, CAPILLARY

## 2010-08-22 LAB — GLUCOSE, CAPILLARY

## 2010-08-27 LAB — GLUCOSE, CAPILLARY: Glucose-Capillary: 109 mg/dL — ABNORMAL HIGH (ref 70–99)

## 2010-09-03 LAB — GLUCOSE, CAPILLARY: Glucose-Capillary: 149 mg/dL — ABNORMAL HIGH (ref 70–99)

## 2010-09-10 LAB — GLUCOSE, CAPILLARY: Glucose-Capillary: 129 mg/dL — ABNORMAL HIGH (ref 70–99)

## 2010-09-12 LAB — GLUCOSE, CAPILLARY
Glucose-Capillary: 104 mg/dL — ABNORMAL HIGH (ref 70–99)
Glucose-Capillary: 165 mg/dL — ABNORMAL HIGH (ref 70–99)

## 2010-09-17 LAB — GLUCOSE, CAPILLARY: Glucose-Capillary: 120 mg/dL — ABNORMAL HIGH (ref 70–99)

## 2010-09-19 LAB — GLUCOSE, CAPILLARY: Glucose-Capillary: 113 mg/dL — ABNORMAL HIGH (ref 70–99)

## 2010-09-24 LAB — GLUCOSE, CAPILLARY

## 2010-09-26 LAB — GLUCOSE, CAPILLARY: Glucose-Capillary: 96 mg/dL (ref 70–99)

## 2010-10-01 LAB — GLUCOSE, CAPILLARY
Glucose-Capillary: 160 mg/dL — ABNORMAL HIGH (ref 70–99)
Glucose-Capillary: 123 mg/dL — ABNORMAL HIGH (ref 70–99)

## 2010-10-08 LAB — GLUCOSE, CAPILLARY: Glucose-Capillary: 89 mg/dL (ref 70–99)

## 2010-10-15 LAB — GLUCOSE, CAPILLARY
Glucose-Capillary: 97 mg/dL (ref 70–99)
Glucose-Capillary: 135 mg/dL — ABNORMAL HIGH (ref 70–99)

## 2010-10-17 LAB — GLUCOSE, CAPILLARY
Glucose-Capillary: 98 mg/dL (ref 70–99)
Glucose-Capillary: 127 mg/dL — ABNORMAL HIGH (ref 70–99)

## 2010-10-21 ENCOUNTER — Inpatient Hospital Stay (HOSPITAL_COMMUNITY)
Admission: EM | Admit: 2010-10-21 | Discharge: 2010-10-26 | DRG: 154 | Disposition: A | Discharge status: Skilled Nursing Facility | Payer: No Typology Code available for payment source | Attending: Internal Medicine | Admitting: Internal Medicine

## 2010-10-21 ENCOUNTER — Emergency Department (HOSPITAL_COMMUNITY): Payer: No Typology Code available for payment source

## 2010-10-21 ENCOUNTER — Ambulatory Visit (HOSPITAL_COMMUNITY): Payer: No Typology Code available for payment source | Attending: Internal Medicine

## 2010-10-21 ENCOUNTER — Encounter (HOSPITAL_COMMUNITY): Payer: Self-pay | Admitting: *Deleted

## 2010-10-21 DIAGNOSIS — J449 Chronic obstructive pulmonary disease, unspecified: Secondary | ICD-10-CM | POA: Diagnosis present

## 2010-10-21 DIAGNOSIS — F329 Major depressive disorder, single episode, unspecified: Secondary | ICD-10-CM | POA: Diagnosis present

## 2010-10-21 DIAGNOSIS — C3410 Malignant neoplasm of upper lobe, unspecified bronchus or lung: Secondary | ICD-10-CM | POA: Diagnosis present

## 2010-10-21 DIAGNOSIS — F418 Other specified anxiety disorders: Secondary | ICD-10-CM | POA: Diagnosis present

## 2010-10-21 DIAGNOSIS — I10 Essential (primary) hypertension: Secondary | ICD-10-CM | POA: Diagnosis present

## 2010-10-21 DIAGNOSIS — C3490 Malignant neoplasm of unspecified part of unspecified bronchus or lung: Secondary | ICD-10-CM | POA: Diagnosis present

## 2010-10-21 DIAGNOSIS — D649 Anemia, unspecified: Secondary | ICD-10-CM | POA: Diagnosis present

## 2010-10-21 DIAGNOSIS — K219 Gastro-esophageal reflux disease without esophagitis: Secondary | ICD-10-CM | POA: Diagnosis present

## 2010-10-21 DIAGNOSIS — L0211 Cutaneous abscess of neck: Secondary | ICD-10-CM | POA: Diagnosis present

## 2010-10-21 DIAGNOSIS — I4891 Unspecified atrial fibrillation: Secondary | ICD-10-CM | POA: Diagnosis present

## 2010-10-21 DIAGNOSIS — E871 Hypo-osmolality and hyponatremia: Secondary | ICD-10-CM | POA: Diagnosis present

## 2010-10-21 DIAGNOSIS — E876 Hypokalemia: Secondary | ICD-10-CM | POA: Diagnosis not present

## 2010-10-21 DIAGNOSIS — F0390 Unspecified dementia without behavioral disturbance: Secondary | ICD-10-CM | POA: Diagnosis present

## 2010-10-21 DIAGNOSIS — Z66 Do not resuscitate: Secondary | ICD-10-CM | POA: Diagnosis present

## 2010-10-21 DIAGNOSIS — R221 Localized swelling, mass and lump, neck: Secondary | ICD-10-CM | POA: Insufficient documentation

## 2010-10-21 DIAGNOSIS — J9611 Chronic respiratory failure with hypoxia: Secondary | ICD-10-CM | POA: Diagnosis present

## 2010-10-21 DIAGNOSIS — F3289 Other specified depressive episodes: Secondary | ICD-10-CM | POA: Diagnosis present

## 2010-10-21 DIAGNOSIS — D509 Iron deficiency anemia, unspecified: Secondary | ICD-10-CM | POA: Diagnosis present

## 2010-10-21 DIAGNOSIS — J4489 Other specified chronic obstructive pulmonary disease: Secondary | ICD-10-CM | POA: Diagnosis present

## 2010-10-21 DIAGNOSIS — Z7901 Long term (current) use of anticoagulants: Secondary | ICD-10-CM

## 2010-10-21 DIAGNOSIS — J189 Pneumonia, unspecified organism: Secondary | ICD-10-CM | POA: Diagnosis present

## 2010-10-21 DIAGNOSIS — F411 Generalized anxiety disorder: Secondary | ICD-10-CM | POA: Diagnosis present

## 2010-10-21 DIAGNOSIS — K1120 Sialoadenitis, unspecified: Secondary | ICD-10-CM

## 2010-10-21 DIAGNOSIS — R509 Fever, unspecified: Secondary | ICD-10-CM | POA: Insufficient documentation

## 2010-10-21 DIAGNOSIS — D638 Anemia in other chronic diseases classified elsewhere: Secondary | ICD-10-CM | POA: Diagnosis present

## 2010-10-21 DIAGNOSIS — R220 Localized swelling, mass and lump, head: Secondary | ICD-10-CM | POA: Insufficient documentation

## 2010-10-21 DIAGNOSIS — K5900 Constipation, unspecified: Secondary | ICD-10-CM | POA: Diagnosis present

## 2010-10-21 HISTORY — DX: Unspecified dementia, unspecified severity, without behavioral disturbance, psychotic disturbance, mood disturbance, and anxiety: F03.90

## 2010-10-21 HISTORY — DX: Depression, unspecified: F32.A

## 2010-10-21 HISTORY — DX: Other nonspecific abnormal finding of lung field: R91.8

## 2010-10-21 HISTORY — DX: Chronic respiratory failure with hypoxia: J96.11

## 2010-10-21 HISTORY — DX: Chronic obstructive pulmonary disease, unspecified: J44.9

## 2010-10-21 HISTORY — DX: Anemia, unspecified: D64.9

## 2010-10-21 HISTORY — DX: Major depressive disorder, single episode, unspecified: F32.9

## 2010-10-21 MED ORDER — CLINDAMYCIN PHOSPHATE 900 MG/50ML IV SOLN
900.0000 mg | Freq: Once | INTRAVENOUS | Status: AC
  Administered 2010-10-21: 900 mg via INTRAVENOUS
  Filled 2010-10-21: qty 50

## 2010-10-21 MED ORDER — OXYCODONE HCL 10 MG PO TB12
20.0000 mg | ORAL_TABLET | Freq: Two times a day (BID) | ORAL | Status: DC
  Administered 2010-10-21: 20 mg via ORAL
  Filled 2010-10-21: qty 2

## 2010-10-21 MED ORDER — ALPRAZOLAM 0.5 MG PO TABS
0.5000 mg | ORAL_TABLET | Freq: Once | ORAL | Status: AC
  Administered 2010-10-21: 0.5 mg via ORAL
  Filled 2010-10-21: qty 1

## 2010-10-21 MED ORDER — SODIUM CHLORIDE 0.9 % IV SOLN
INTRAVENOUS | Status: DC
  Administered 2010-10-21: 23:00:00 via INTRAVENOUS

## 2010-10-21 MED ORDER — IOHEXOL 300 MG/ML  SOLN
100.0000 mL | Freq: Once | INTRAMUSCULAR | Status: AC | PRN
  Administered 2010-10-21: 100 mL via INTRAVENOUS

## 2010-10-21 MED ORDER — ALBUTEROL SULFATE (5 MG/ML) 0.5% IN NEBU
2.5000 mg | INHALATION_SOLUTION | Freq: Four times a day (QID) | RESPIRATORY_TRACT | Status: DC
  Administered 2010-10-22: 2.5 mg via RESPIRATORY_TRACT
  Filled 2010-10-21: qty 0.5

## 2010-10-21 NOTE — ED Notes (Addendum)
Advised pt/daughter unable to give any meds for pain/anxiety until seen/ordered by EDP. Lights down for comfort/blanket given

## 2010-10-21 NOTE — ED Notes (Signed)
Pt also had labs approx x2 hrs ago. Pt is sob and increases with activity. Pt requesting pain pill

## 2010-10-21 NOTE — ED Notes (Signed)
Lt side of neck red, swollen   Recent dx of pneumonia

## 2010-10-21 NOTE — ED Provider Notes (Signed)
Scribed for Ward Givens, MD, the patient was seen in room [facility] . This chart was scribed by Ellie Lunch. This patient's care was started at 9:34 PM.   CSN: [id number] Arrival date & time: 10/21/2010  8:58 PM  Chief Complaint  Patient presents with  . Fever   HPI Madison Sanford is a 77 y.o. female resident of [facility] with a history of COPD presents to the Emergency Department complaining of pain/redness/swelling on the left side of her neck and fever. Pt's neck began to swell this morning. Neck pain is aggravated on palpation. Denies pain when eating, chewing, and swallowing. Denies wearing dentures. Reports breathing is worse than baseline. Pt has chronic, productive cough. Family reports Pt is in last stages of COPD. Pt has chronic productive cough with dark phlegm that might contain blood. Pt was seen  today for out patient x-ray and was diagnosed with  Pneumonia. She was started on Augmentin.  Additionally daughter reports pt has a "spot on right lung" and has arthritic pain in her right shoulder.   Past Medical History  Diagnosis Date  . Hypertension   . Dementia   . COPD (chronic obstructive pulmonary disease)   . Depression   . Chronic respiratory failure with hypoxia   . Lung mass     History reviewed. No pertinent past surgical history.  MEDICATIONS:  Previous Medications   ALPRAZOLAM (XANAX) 0.5 MG TABLET    Take 0.5 mg by mouth 2 (two) times daily.     BUDESONIDE (PULMICORT) 0.25 MG/2ML NEBULIZER SOLUTION    Take 0.25 mg by nebulization 2 (two) times daily.     BUPROPION (WELLBUTRIN XL) 150 MG 24 HR TABLET    Take 150 mg by mouth daily.     CYCLOSPORINE (RESTASIS) 0.05 % OPHTHALMIC EMULSION    Place 1 drop into both eyes 2 (two) times daily.     DIGOXIN (LANOXIN) 0.25 MG TABLET    Take 250 mcg by mouth daily.     DILTIAZEM (CARDIZEM CD) 240 MG 24 HR CAPSULE    Take 240 mg by mouth daily.     DOXEPIN (SINEQUAN) 10 MG CAPSULE    Take 20 mg by mouth at  bedtime.     ESOMEPRAZOLE (NEXIUM) 40 MG CAPSULE    Take 40 mg by mouth daily. On an empty stomach    FUROSEMIDE (LASIX) 20 MG TABLET    Take 60 mg by mouth daily.     GUAIFENESIN (MUCINEX) 600 MG 12 HR TABLET    Take 1,200 mg by mouth 2 (two) times daily.     IPRATROPIUM (ATROVENT) 0.02 % NEBULIZER SOLUTION    Take 500 mcg by nebulization every 6 (six) hours.     LEVALBUTEROL (XOPENEX) 1.25 MG/3ML NEBULIZER SOLUTION    Take 1 ampule by nebulization every 6 (six) hours as needed.     LIDOCAINE (LIDODERM) 5 %    Place 1 patch onto the skin daily. Apply to shoulder. Remove & Discard patch within 12 hours or as directed by MD    METOPROLOL TARTRATE (LOPRESSOR) 25 MG TABLET    Take 25 mg by mouth 2 (two) times daily.     OXYCODONE (OXYCONTIN) 20 MG 12 HR TABLET    Take 20 mg by mouth every 12 (twelve) hours.     POLYETHYLENE GLYCOL POWDER (GLYCOLAX/MIRALAX) POWDER    Take 17 g by mouth daily.     POTASSIUM CHLORIDE (KLOR-CON) 10 MEQ CR TABLET  Take 30 mEq by mouth every morning.     POTASSIUM CHLORIDE SA (K-DUR,KLOR-CON) 20 MEQ TABLET    Take 20 mEq by mouth every evening.     PREDNISONE (DELTASONE) 10 MG TABLET    Take 10 mg by mouth daily. With food    PRIMIDONE (MYSOLINE) 50 MG TABLET    Take 25 mg by mouth 3 (three) times daily. One-half tablet three times daily    SENNA-DOCUSATE (SENOKOT S) 8.6-50 MG PER TABLET    Take 1 tablet by mouth every morning. **Hold for Diarrhea**    SERTRALINE (ZOLOFT) 100 MG TABLET    Take 100 mg by mouth daily. Along with Zoloft 50mg  to equal a total dose of 150mg  daily    SERTRALINE (ZOLOFT) 50 MG TABLET    Take 50 mg by mouth daily. Take with Zoloft 100mg  to equal a total daily dose of 150mg  daily    TIOTROPIUM (SPIRIVA) 18 MCG INHALATION CAPSULE    Place 18 mcg into inhaler and inhale daily.     TRAZODONE (DESYREL) 50 MG TABLET    Take 50 mg by mouth at bedtime.     WARFARIN (COUMADIN) 10 MG TABLET    Take 10 mg by mouth as directed. On Mondays, Wednesdays,  Fridays, and Sundays. **As Directed on MAR**    WARFARIN (COUMADIN) 10 MG TABLET    Take 10.5 mg by mouth as directed. On Tuesdays, Thursdays, and Saturdays **As Directed on MAR**      ALLERGIES:  Allergies as of 10/21/2010 - Review Complete 10/21/2010  Allergen Reaction Noted  . Codeine  10/21/2010      History reviewed. No pertinent family history.  History  Substance Use Topics  . Smoking status: Not on file  . Smokeless tobacco: Not on file  . Alcohol Use: No  Pt is former smoker, lives in a nursing home, does not drink.  Review of Systems 10 Systems reviewed and are negative for acute change except as noted in the HPI.  Physical Exam  BP 151/60  Pulse 87  Temp(Src) 100.3 F (37.9 C) (Rectal)  Resp 24  SpO2 98%  Physical Exam  Constitutional: She appears well-developed and well-nourished.  HENT:  Head: Normocephalic.         PT has moderate redness with swelling that starts over her left parotid region and goes down into her left neck. She is missing several lower molars on the left but she has one that has a large cx, so this may be from her tooth, or parotiditis, lymphadenitis or cellulitis.  Eyes: Conjunctivae and EOM are normal. Pupils are equal, round, and reactive to light.  Neck:       See HEENT comments  Cardiovascular: Normal rate and regular rhythm.   Pulmonary/Chest: Tachypnea noted. She has rhonchi.  Abdominal: Soft. Normal appearance. There is no tenderness.  Musculoskeletal:       Pt has no acute abnormality.   Neurological: She is alert.  Skin: Skin is warm and dry.  Psychiatric: Her mood appears anxious.    Procedures  . OTHER DATA REVIEWED: Nursing notes, vital signs, and past medical records reviewed.   DIAGNOSTIC STUDIES: Oxygen Saturation is 98% on 4 liters/min via Patient connected to nasal cannula oxygen, normal by my interpretation.    LABS / RADIOLOGY: Pt had outpatient labs today done here at [facility] with elevated WBC of 22K  otherwise no acute changes (normal BUN/creat). She also had an outpatient CXR showing new left lingular infiltrate.  ED COURSE / COORDINATION OF CARE: Pt received nebulizer tx, her home medications and IV clindamycin.  Blood Cultures were not done since she had already received antibiotics at her NH (Augmenti).   MDM: Dg Chest 2 View  10/21/2010  *RADIOLOGY REPORT*  Clinical Data: Shortness of breath, cough  CHEST - 2 VIEW  Comparison: 07/03/2010 Correlation:  CT chest 07/04/2010  Findings: Enlargement of cardiac silhouette. Calcified tortuous aorta. Pulmonary vascularity normal. Bilateral breast prostheses. Left basilar infiltrate new since prior exam. Underlying emphysematous and bronchitic changes. Remaining lungs grossly clear. Nodular density in the right upper lobe is unchanged, appearance on prior CT worrisome for right upper lobe malignancy. Bones diffusely deep mineralized with scattered end plate spur formation and disc space narrowing.  IMPRESSION: Emphysematous and bronchitic changes with new left basilar infiltrate compatible with pneumonia. Right upper lobe nodular density worrisome for malignancy based on prior CT appearance, please reference that report of the prior CT exam.  Original Report Authenticated By: Lollie Marrow, M.D.   Ct Soft Tissue Neck W Contrast  10/22/2010  *RADIOLOGY REPORT*  Clinical Data:  Left-sided facial and neck swelling, fever  CT NECK WITH CONTRAST, CT MAXILLOFACIAL WITH CONTRAST  Technique:  Multidetector CT imaging of the neck was performed using the standard protocol following the bolus administration of intravenous contrast.  Multidetector CT imaging of the maxillofacial structures was performed.  Multiplanar CT image reconstructions were also generated.  A small metallic BB was placed on the right temple in order to reliably differentiate right from left.  Contrast: 100 ml Omnipaque 300 IV contrast  CT NECK WITH CONTRAST  Comparison:  Chest CT 07/04/2010   Findings: Findings pertaining to the parotid gland will be discussed in detail below.  Emphysematous changes are noted at the lung apices.  Airway is midline and patent.  Thyroid gland is unremarkable.  Left carotid bulb calcification incidentally noted. A few small presumably reactive submandibular nodes are noted, left greater than right.  Visualized paranasal sinuses are unremarkable. Incomplete left mastoid air cell opacification noted.  IMPRESSION: No acute abnormality in the neck.  Please see comments in dedicated report below regarding left parotid gland.  CT MAXILLOFACIAL WITH CONTRAST  Findings:  There is diffuse stranding / infiltration and enlargement of the left parotid gland, with overlying skin thickening and subcutaneous stranding.  No focal definable rim enhancing fluid collection is seen to suggest abscess formation. There is incomplete opacification of the left mastoid air cells. No osseous destruction or sclerosis is noted. Examination is degraded by patient motion.  Allowing for this, no fracture is identified.  Submandibular glands are normal.  Several small adjacent lymph nodes are noted.  Allowing for the presence of streak artifact from dental amalgam, no definite calcification is seen along the expected course of the left parotid duct to suggest calcification.  IMPRESSION: Swelling and internal stranding of the left parotid gland with overlying soft tissue stranding and skin thickening, likely indicating primary or secondary parotitis with possible overlying cellulitis/local spread of inflammatory reaction.  Malignant infiltration in the setting of the previously seen right upper lobe spiculated mass would be much less likely.  Lymphomatous involvement or other neoplastic involvement would be also similarly less likely.  Original Report Authenticated By: Harrel Lemon, M.D.   Ct Maxillofacial W/cm  10/22/2010  *RADIOLOGY REPORT*  Clinical Data:  Left-sided facial and neck swelling,  fever  CT NECK WITH CONTRAST, CT MAXILLOFACIAL WITH CONTRAST  Technique:  Multidetector CT imaging of the neck was  performed using the standard protocol following the bolus administration of intravenous contrast.  Multidetector CT imaging of the maxillofacial structures was performed.  Multiplanar CT image reconstructions were also generated.  A small metallic BB was placed on the right temple in order to reliably differentiate right from left.  Contrast: 100 ml Omnipaque 300 IV contrast  CT NECK WITH CONTRAST  Comparison:  Chest CT 07/04/2010  Findings: Findings pertaining to the parotid gland will be discussed in detail below.  Emphysematous changes are noted at the lung apices.  Airway is midline and patent.  Thyroid gland is unremarkable.  Left carotid bulb calcification incidentally noted. A few small presumably reactive submandibular nodes are noted, left greater than right.  Visualized paranasal sinuses are unremarkable. Incomplete left mastoid air cell opacification noted.  IMPRESSION: No acute abnormality in the neck.  Please see comments in dedicated report below regarding left parotid gland.  CT MAXILLOFACIAL WITH CONTRAST  Findings:  There is diffuse stranding / infiltration and enlargement of the left parotid gland, with overlying skin thickening and subcutaneous stranding.  No focal definable rim enhancing fluid collection is seen to suggest abscess formation. There is incomplete opacification of the left mastoid air cells. No osseous destruction or sclerosis is noted. Examination is degraded by patient motion.  Allowing for this, no fracture is identified.  Submandibular glands are normal.  Several small adjacent lymph nodes are noted.  Allowing for the presence of streak artifact from dental amalgam, no definite calcification is seen along the expected course of the left parotid duct to suggest calcification.  IMPRESSION: Swelling and internal stranding of the left parotid gland with overlying soft  tissue stranding and skin thickening, likely indicating primary or secondary parotitis with possible overlying cellulitis/local spread of inflammatory reaction.  Malignant infiltration in the setting of the previously seen right upper lobe spiculated mass would be much less likely.  Lymphomatous involvement or other neoplastic involvement would be also similarly less likely.  Original Report Authenticated By: Harrel Lemon, M.D.   1:15 AM Dr Orvan Falconer is going to admit. Request putting on levaquin and rocephin IV which I ordered.    I personally performed the services described in this documentation, which was scribed in my presence. The recorded information has been reviewed and considered. Devoria Albe, MD, Armando Gang  Ward Givens, MD 10/22/10 [phone]

## 2010-10-21 NOTE — ED Notes (Signed)
EDP aware of pt. Lab sending results from earlier.

## 2010-10-22 ENCOUNTER — Inpatient Hospital Stay (HOSPITAL_COMMUNITY): Payer: No Typology Code available for payment source

## 2010-10-22 ENCOUNTER — Encounter (HOSPITAL_COMMUNITY): Payer: Self-pay

## 2010-10-22 DIAGNOSIS — K219 Gastro-esophageal reflux disease without esophagitis: Secondary | ICD-10-CM | POA: Diagnosis present

## 2010-10-22 DIAGNOSIS — J9611 Chronic respiratory failure with hypoxia: Secondary | ICD-10-CM | POA: Diagnosis present

## 2010-10-22 DIAGNOSIS — J449 Chronic obstructive pulmonary disease, unspecified: Secondary | ICD-10-CM | POA: Diagnosis present

## 2010-10-22 DIAGNOSIS — I10 Essential (primary) hypertension: Secondary | ICD-10-CM | POA: Diagnosis present

## 2010-10-22 DIAGNOSIS — F418 Other specified anxiety disorders: Secondary | ICD-10-CM | POA: Diagnosis present

## 2010-10-22 DIAGNOSIS — J189 Pneumonia, unspecified organism: Secondary | ICD-10-CM | POA: Diagnosis present

## 2010-10-22 DIAGNOSIS — F0390 Unspecified dementia without behavioral disturbance: Secondary | ICD-10-CM | POA: Diagnosis present

## 2010-10-22 DIAGNOSIS — C3490 Malignant neoplasm of unspecified part of unspecified bronchus or lung: Secondary | ICD-10-CM | POA: Diagnosis present

## 2010-10-22 DIAGNOSIS — I4891 Unspecified atrial fibrillation: Secondary | ICD-10-CM | POA: Diagnosis present

## 2010-10-22 DIAGNOSIS — K1120 Sialoadenitis, unspecified: Secondary | ICD-10-CM | POA: Diagnosis present

## 2010-10-22 LAB — URINALYSIS, ROUTINE W REFLEX MICROSCOPIC
Glucose, UA: NEGATIVE mg/dL
Ketones, ur: NEGATIVE mg/dL
Leukocytes, UA: NEGATIVE
pH: 5.5 (ref 5.0–8.0)

## 2010-10-22 LAB — BASIC METABOLIC PANEL
Potassium: 3.6 mEq/L (ref 3.5–5.1)
Sodium: 131 mEq/L — ABNORMAL LOW (ref 135–145)

## 2010-10-22 LAB — MRSA PCR SCREENING: MRSA by PCR: POSITIVE — AB

## 2010-10-22 LAB — CBC
HCT: 32.1 % — ABNORMAL LOW (ref 36.0–46.0)
MCH: 22 pg — ABNORMAL LOW (ref 26.0–34.0)
MCV: 71.5 fL — ABNORMAL LOW (ref 78.0–100.0)
RDW: 17 % — ABNORMAL HIGH (ref 11.5–15.5)
WBC: 19.4 10*3/uL — ABNORMAL HIGH (ref 4.0–10.5)

## 2010-10-22 LAB — PRO B NATRIURETIC PEPTIDE: Pro B Natriuretic peptide (BNP): 1080 pg/mL — ABNORMAL HIGH (ref 0–450)

## 2010-10-22 LAB — LACTIC ACID, PLASMA: Lactic Acid, Venous: 0.7 mmol/L (ref 0.5–2.2)

## 2010-10-22 LAB — DIFFERENTIAL
Basophils Absolute: 0 10*3/uL (ref 0.0–0.1)
Eosinophils Absolute: 0 10*3/uL (ref 0.0–0.7)
Eosinophils Relative: 0 % (ref 0–5)
Lymphocytes Relative: 7 % — ABNORMAL LOW (ref 12–46)
Monocytes Absolute: 1.7 10*3/uL — ABNORMAL HIGH (ref 0.1–1.0)

## 2010-10-22 MED ORDER — VANCOMYCIN HCL IN DEXTROSE 1-5 GM/200ML-% IV SOLN
INTRAVENOUS | Status: AC
  Filled 2010-10-22: qty 200

## 2010-10-22 MED ORDER — LEVALBUTEROL HCL 1.25 MG/3ML IN NEBU
1.2500 mg | INHALATION_SOLUTION | Freq: Four times a day (QID) | RESPIRATORY_TRACT | Status: DC | PRN
  Administered 2010-10-22 – 2010-10-26 (×13): 1.25 mg via RESPIRATORY_TRACT
  Filled 2010-10-22 (×14): qty 3

## 2010-10-22 MED ORDER — GUAIFENESIN ER 600 MG PO TB12
1200.0000 mg | ORAL_TABLET | Freq: Two times a day (BID) | ORAL | Status: DC
  Administered 2010-10-22 – 2010-10-26 (×8): 1200 mg via ORAL
  Filled 2010-10-22 (×3): qty 2
  Filled 2010-10-22: qty 1
  Filled 2010-10-22 (×2): qty 2
  Filled 2010-10-22: qty 1
  Filled 2010-10-22: qty 2
  Filled 2010-10-22: qty 1

## 2010-10-22 MED ORDER — TOBRAMYCIN-DEXAMETHASONE 0.3-0.1 % OP OINT
TOPICAL_OINTMENT | OPHTHALMIC | Status: DC
  Administered 2010-10-23 – 2010-10-24 (×6): via OPHTHALMIC
  Administered 2010-10-24: 1 via OPHTHALMIC
  Administered 2010-10-24 – 2010-10-25 (×3): via OPHTHALMIC
  Filled 2010-10-22 (×2): qty 3.5

## 2010-10-22 MED ORDER — CYCLOSPORINE 0.05 % OP EMUL
1.0000 [drp] | Freq: Two times a day (BID) | OPHTHALMIC | Status: DC
  Administered 2010-10-22 – 2010-10-26 (×8): 1 [drp] via OPHTHALMIC
  Filled 2010-10-22 (×14): qty 1

## 2010-10-22 MED ORDER — SERTRALINE HCL 50 MG PO TABS
50.0000 mg | ORAL_TABLET | Freq: Every day | ORAL | Status: DC
  Administered 2010-10-22 – 2010-10-26 (×4): 50 mg via ORAL
  Filled 2010-10-22 (×5): qty 1

## 2010-10-22 MED ORDER — LIDOCAINE 5 % EX PTCH
1.0000 | MEDICATED_PATCH | CUTANEOUS | Status: DC
  Administered 2010-10-23 – 2010-10-26 (×4): 1 via TRANSDERMAL
  Filled 2010-10-22 (×7): qty 1

## 2010-10-22 MED ORDER — ACETAMINOPHEN 325 MG PO TABS
650.0000 mg | ORAL_TABLET | ORAL | Status: DC | PRN
  Administered 2010-10-24: 650 mg via ORAL
  Filled 2010-10-22: qty 2

## 2010-10-22 MED ORDER — SENNOSIDES-DOCUSATE SODIUM 8.6-50 MG PO TABS
1.0000 | ORAL_TABLET | ORAL | Status: DC
  Administered 2010-10-23 – 2010-10-26 (×4): 1 via ORAL
  Filled 2010-10-22 (×6): qty 1

## 2010-10-22 MED ORDER — SERTRALINE HCL 50 MG PO TABS
100.0000 mg | ORAL_TABLET | Freq: Every day | ORAL | Status: DC
  Administered 2010-10-22 – 2010-10-26 (×4): 100 mg via ORAL
  Filled 2010-10-22 (×4): qty 2
  Filled 2010-10-22: qty 1

## 2010-10-22 MED ORDER — PANTOPRAZOLE SODIUM 40 MG PO TBEC
80.0000 mg | DELAYED_RELEASE_TABLET | Freq: Every day | ORAL | Status: DC
  Administered 2010-10-22 – 2010-10-24 (×3): 80 mg via ORAL
  Filled 2010-10-22 (×4): qty 2

## 2010-10-22 MED ORDER — DEXTROSE 5 % IV SOLN
1.0000 g | Freq: Three times a day (TID) | INTRAVENOUS | Status: DC
  Administered 2010-10-22 – 2010-10-25 (×10): 1 g via INTRAVENOUS
  Filled 2010-10-22 (×9): qty 1

## 2010-10-22 MED ORDER — TRAZODONE HCL 50 MG PO TABS: 50.0000 mg | ORAL_TABLET | Freq: Every day | ORAL | Status: DC

## 2010-10-22 MED ORDER — LEVOFLOXACIN IN D5W 750 MG/150ML IV SOLN
750.0000 mg | INTRAVENOUS | Status: DC
  Administered 2010-10-22 – 2010-10-24 (×3): 750 mg via INTRAVENOUS
  Filled 2010-10-22 (×3): qty 150

## 2010-10-22 MED ORDER — FUROSEMIDE 40 MG PO TABS
60.0000 mg | ORAL_TABLET | Freq: Every day | ORAL | Status: DC
  Administered 2010-10-22 – 2010-10-26 (×4): 60 mg via ORAL
  Filled 2010-10-22 (×2): qty 1
  Filled 2010-10-22 (×2): qty 3
  Filled 2010-10-22: qty 2

## 2010-10-22 MED ORDER — OXYCODONE HCL 20 MG PO TB12
20.0000 mg | ORAL_TABLET | Freq: Two times a day (BID) | ORAL | Status: DC
  Administered 2010-10-22 – 2010-10-26 (×8): 20 mg via ORAL
  Filled 2010-10-22 (×8): qty 1

## 2010-10-22 MED ORDER — SODIUM CHLORIDE 0.9 % IV SOLN
INTRAVENOUS | Status: DC
  Filled 2010-10-22 (×6): qty 1000

## 2010-10-22 MED ORDER — IPRATROPIUM BROMIDE 0.02 % IN SOLN: 0.5000 mg | Freq: Four times a day (QID) | RESPIRATORY_TRACT | Status: DC | PRN

## 2010-10-22 MED ORDER — TRAZODONE HCL 50 MG PO TABS
50.0000 mg | ORAL_TABLET | Freq: Every evening | ORAL | Status: DC | PRN
  Administered 2010-10-23 – 2010-10-24 (×2): 50 mg via ORAL
  Filled 2010-10-22 (×2): qty 1

## 2010-10-22 MED ORDER — MUPIROCIN 2 % EX OINT
1.0000 "application " | TOPICAL_OINTMENT | Freq: Two times a day (BID) | CUTANEOUS | Status: DC
  Administered 2010-10-22 – 2010-10-26 (×9): 1 via NASAL
  Filled 2010-10-22: qty 22

## 2010-10-22 MED ORDER — DEXTROSE 5 % IV SOLN
1.0000 g | Freq: Once | INTRAVENOUS | Status: AC
  Administered 2010-10-22: 1 g via INTRAVENOUS
  Filled 2010-10-22: qty 1

## 2010-10-22 MED ORDER — POLYETHYLENE GLYCOL 3350 17 GM/SCOOP PO POWD
17.0000 g | Freq: Every day | ORAL | Status: DC
  Filled 2010-10-22: qty 255

## 2010-10-22 MED ORDER — CHLORHEXIDINE GLUCONATE CLOTH 2 % EX PADS
6.0000 | MEDICATED_PAD | Freq: Every day | CUTANEOUS | Status: AC
  Administered 2010-10-22 – 2010-10-26 (×5): 6 via TOPICAL

## 2010-10-22 MED ORDER — FLEET ENEMA 7-19 GM/118ML RE ENEM: 1.0000 | ENEMA | RECTAL | Status: DC | PRN

## 2010-10-22 MED ORDER — WARFARIN SODIUM 10 MG PO TABS: 10.0000 mg | ORAL_TABLET | ORAL | Status: DC

## 2010-10-22 MED ORDER — LEVOFLOXACIN IN D5W 250 MG/50ML IV SOLN
250.0000 mg | Freq: Once | INTRAVENOUS | Status: DC
  Administered 2010-10-22: 250 mg via INTRAVENOUS
  Filled 2010-10-22: qty 50

## 2010-10-22 MED ORDER — BUDESONIDE 0.25 MG/2ML IN SUSP
0.2500 mg | Freq: Two times a day (BID) | RESPIRATORY_TRACT | Status: DC
  Administered 2010-10-22 – 2010-10-26 (×8): 0.25 mg via RESPIRATORY_TRACT
  Filled 2010-10-22 (×14): qty 2

## 2010-10-22 MED ORDER — IPRATROPIUM BROMIDE 0.02 % IN SOLN
500.0000 ug | Freq: Four times a day (QID) | RESPIRATORY_TRACT | Status: DC
  Administered 2010-10-22 – 2010-10-26 (×17): 500 ug via RESPIRATORY_TRACT
  Filled 2010-10-22 (×17): qty 2.5

## 2010-10-22 MED ORDER — VANCOMYCIN HCL IN DEXTROSE 1-5 GM/200ML-% IV SOLN
1000.0000 mg | Freq: Once | INTRAVENOUS | Status: AC
  Administered 2010-10-22: 1000 mg via INTRAVENOUS
  Filled 2010-10-22: qty 200

## 2010-10-22 MED ORDER — VANCOMYCIN HCL IN DEXTROSE 1-5 GM/200ML-% IV SOLN
1000.0000 mg | Freq: Two times a day (BID) | INTRAVENOUS | Status: DC
  Administered 2010-10-22 – 2010-10-24 (×4): 1000 mg via INTRAVENOUS
  Filled 2010-10-22 (×4): qty 200

## 2010-10-22 MED ORDER — POLYETHYLENE GLYCOL 3350 17 GM/SCOOP PO POWD: 17.0000 g | Freq: Every day | ORAL | Status: DC

## 2010-10-22 MED ORDER — WARFARIN SODIUM 7.5 MG PO TABS: 10.5000 mg | ORAL_TABLET | ORAL | Status: DC

## 2010-10-22 MED ORDER — DIGOXIN 250 MCG PO TABS
250.0000 ug | ORAL_TABLET | Freq: Every day | ORAL | Status: DC
  Administered 2010-10-22 – 2010-10-26 (×4): 250 ug via ORAL
  Filled 2010-10-22 (×5): qty 1

## 2010-10-22 MED ORDER — POTASSIUM CHLORIDE IN NACL 20-0.9 MEQ/L-% IV SOLN
INTRAVENOUS | Status: DC
  Administered 2010-10-22 – 2010-10-25 (×6): via INTRAVENOUS

## 2010-10-22 MED ORDER — ALPRAZOLAM 0.5 MG PO TABS
0.5000 mg | ORAL_TABLET | Freq: Two times a day (BID) | ORAL | Status: DC
  Administered 2010-10-22 – 2010-10-26 (×8): 0.5 mg via ORAL
  Filled 2010-10-22 (×9): qty 1

## 2010-10-22 MED ORDER — SODIUM CHLORIDE 0.9 % IV SOLN: INTRAVENOUS | Status: DC

## 2010-10-22 MED ORDER — PRIMIDONE 50 MG PO TABS
25.0000 mg | ORAL_TABLET | Freq: Three times a day (TID) | ORAL | Status: DC
  Administered 2010-10-22 – 2010-10-26 (×12): 25 mg via ORAL
  Filled 2010-10-22 (×19): qty 1

## 2010-10-22 MED ORDER — DILTIAZEM HCL ER COATED BEADS 240 MG PO CP24
240.0000 mg | ORAL_CAPSULE | Freq: Every day | ORAL | Status: DC
  Administered 2010-10-22 – 2010-10-26 (×4): 240 mg via ORAL
  Filled 2010-10-22 (×5): qty 1

## 2010-10-22 MED ORDER — POLYETHYLENE GLYCOL 3350 17 G PO PACK
17.0000 g | PACK | Freq: Every day | ORAL | Status: DC
  Administered 2010-10-22 – 2010-10-23 (×2): 17 g via ORAL
  Filled 2010-10-22 (×3): qty 1

## 2010-10-22 MED ORDER — BUPROPION HCL ER (XL) 150 MG PO TB24
150.0000 mg | ORAL_TABLET | Freq: Every day | ORAL | Status: DC
  Administered 2010-10-22 – 2010-10-26 (×4): 150 mg via ORAL
  Filled 2010-10-22 (×7): qty 1

## 2010-10-22 MED ORDER — DOXEPIN HCL 10 MG PO CAPS
20.0000 mg | ORAL_CAPSULE | Freq: Every day | ORAL | Status: DC
  Administered 2010-10-22 – 2010-10-25 (×4): 20 mg via ORAL
  Filled 2010-10-22 (×6): qty 2

## 2010-10-22 MED ORDER — METOPROLOL TARTRATE 25 MG PO TABS
25.0000 mg | ORAL_TABLET | Freq: Two times a day (BID) | ORAL | Status: DC
  Administered 2010-10-22 – 2010-10-26 (×8): 25 mg via ORAL
  Filled 2010-10-22 (×9): qty 1

## 2010-10-22 MED ORDER — LEVOFLOXACIN IN D5W 500 MG/100ML IV SOLN
500.0000 mg | Freq: Once | INTRAVENOUS | Status: AC
  Administered 2010-10-22: 500 mg via INTRAVENOUS
  Filled 2010-10-22: qty 100

## 2010-10-22 MED ORDER — TIOTROPIUM BROMIDE MONOHYDRATE 18 MCG IN CAPS
18.0000 ug | ORAL_CAPSULE | Freq: Every day | RESPIRATORY_TRACT | Status: DC
  Filled 2010-10-22: qty 5

## 2010-10-22 NOTE — ED Provider Notes (Signed)
Pt is a DNR  Ward Givens, MD 10/22/10 [phone]

## 2010-10-22 NOTE — Progress Notes (Signed)
Subjective: This lady was admitted yesterday with a left parotiditis and pneumonia. She also has COPD and a right lung mass, likely malignancy.           Physical Exam: Blood pressure 154/77, pulse 84, temperature 99.5 F (37.5 C), temperature source Oral, resp. rate 20, height 5\' 4"  (1.626 m), weight 71.7 kg (158 lb 1.1 oz), SpO2 92.00%. She looks systemically well and is not toxic or septic. She has a large parotiditis which is very tender and surrounding cellulitis. Lung fields show crackles in the right mid and lower zones. Abdomen is soft with no hepatomegaly. She is alert. She is not orientated and time place and person. I think this is probably her baseline demented state.   Investigations: Results for orders placed during the hospital encounter of 10/21/10 (from the past 48 hour(s))  MRSA PCR SCREENING     Status: Abnormal   Collection Time   10/22/10  3:30 AM      Component Value Range Comment   MRSA by PCR POSITIVE (*) NEGATIVE    PRO B NATRIURETIC PEPTIDE     Status: Abnormal   Collection Time   10/22/10  5:07 AM      Component Value Range Comment   BNP, POC 1080.0 (*) 0 - 450 (pg/mL)   CBC     Status: Abnormal   Collection Time   10/22/10  5:08 AM      Component Value Range Comment   WBC 19.4 (*) 4.0 - 10.5 (K/uL)    RBC 4.49  3.87 - 5.11 (MIL/uL)    Hemoglobin 9.9 (*) 12.0 - 15.0 (g/dL)    HCT 65.7 (*) 84.6 - 46.0 (%)    MCV 71.5 (*) 78.0 - 100.0 (fL)    MCH 22.0 (*) 26.0 - 34.0 (pg)    MCHC 30.8  30.0 - 36.0 (g/dL)    RDW 96.2 (*) 95.2 - 15.5 (%)    Platelets 237  150 - 400 (K/uL)   DIFFERENTIAL     Status: Abnormal   Collection Time   10/22/10  5:08 AM      Component Value Range Comment   Neutrophils Relative 84 (*) 43 - 77 (%)    Neutro Abs 16.2 (*) 1.7 - 7.7 (K/uL)    Lymphocytes Relative 7 (*) 12 - 46 (%)    Lymphs Abs 1.4  0.7 - 4.0 (K/uL)    Monocytes Relative 9  3 - 12 (%)    Monocytes Absolute 1.7 (*) 0.1 - 1.0 (K/uL)    Eosinophils Relative 0  0  - 5 (%)    Eosinophils Absolute 0.0  0.0 - 0.7 (K/uL)    Basophils Relative 0  0 - 1 (%)    Basophils Absolute 0.0  0.0 - 0.1 (K/uL)   PROTIME-INR     Status: Abnormal   Collection Time   10/22/10  5:08 AM      Component Value Range Comment   Prothrombin Time 33.5 (*) 11.6 - 15.2 (seconds)    INR 3.23 (*) 0.00 - 1.49    DIGOXIN LEVEL     Status: Normal   Collection Time   10/22/10  5:08 AM      Component Value Range Comment   Digoxin Level 0.9  0.8 - 2.0 (ng/mL)   PROCALCITONIN     Status: Normal   Collection Time   10/22/10  5:15 AM      Component Value Range Comment   Procalcitonin 0.23  BASIC METABOLIC PANEL     Status: Abnormal   Collection Time   10/22/10  5:15 AM      Component Value Range Comment   Sodium 131 (*) 135 - 145 (mEq/L)    Potassium 3.6  3.5 - 5.1 (mEq/L)    Chloride 93 (*) 96 - 112 (mEq/L)    CO2 28  19 - 32 (mEq/L)    Glucose, Bld 95  70 - 99 (mg/dL)    BUN 10  6 - 23 (mg/dL)    Creatinine, Ser <4.54 (*) 0.50 - 1.10 (mg/dL)    Calcium 8.6  8.4 - 10.5 (mg/dL)    GFR calc non Af Amer NOT CALCULATED  >60 (mL/min)    GFR calc Af Amer NOT CALCULATED  >60 (mL/min)   CULTURE, BLOOD (ROUTINE X 2)     Status: Normal (Preliminary result)   Collection Time   10/22/10  5:16 AM      Component Value Range Comment   Specimen Description Blood LEFT ARM 6CC      Special Requests NONE      Culture PENDING      Report Status PENDING     CULTURE, BLOOD (ROUTINE X 2)     Status: Normal (Preliminary result)   Collection Time   10/22/10  5:16 AM      Component Value Range Comment   Specimen Description Blood LEFT HAND [facility]      Special Requests NONE      Culture PENDING      Report Status PENDING     LACTIC ACID, PLASMA     Status: Normal   Collection Time   10/22/10  6:23 AM      Component Value Range Comment   Lactic Acid, Venous 0.7  0.5 - 2.2 (mmol/L)    Recent Results (from the past 240 hour(s))  MRSA PCR SCREENING     Status: Abnormal   Collection Time    10/22/10  3:30 AM      Component Value Range Status Comment   MRSA by PCR POSITIVE (*) NEGATIVE  Final   CULTURE, BLOOD (ROUTINE X 2)     Status: Normal (Preliminary result)   Collection Time   10/22/10  5:16 AM      Component Value Range Status Comment   Specimen Description Blood LEFT ARM [facility]   Final    Special Requests NONE   Final    Culture PENDING   Incomplete    Report Status PENDING   Incomplete   CULTURE, BLOOD (ROUTINE X 2)     Status: Normal (Preliminary result)   Collection Time   10/22/10  5:16 AM      Component Value Range Status Comment   Specimen Description Blood LEFT HAND [facility]   Final    Special Requests NONE   Final    Culture PENDING   Incomplete    Report Status PENDING   Incomplete     Dg Chest 2 View  10/21/2010  *RADIOLOGY REPORT*  Clinical Data: Shortness of breath, cough  CHEST - 2 VIEW  Comparison: 07/03/2010 Correlation:  CT chest 07/04/2010  Findings: Enlargement of cardiac silhouette. Calcified tortuous aorta. Pulmonary vascularity normal. Bilateral breast prostheses. Left basilar infiltrate new since prior exam. Underlying emphysematous and bronchitic changes. Remaining lungs grossly clear. Nodular density in the right upper lobe is unchanged, appearance on prior CT worrisome for right upper lobe malignancy. Bones diffusely deep mineralized with scattered end plate spur formation and disc space  narrowing.  IMPRESSION: Emphysematous and bronchitic changes with new left basilar infiltrate compatible with pneumonia. Right upper lobe nodular density worrisome for malignancy based on prior CT appearance, please reference that report of the prior CT exam.  Original Report Authenticated By: Lollie Marrow, M.D.   Ct Soft Tissue Neck W Contrast  10/22/2010  *RADIOLOGY REPORT*  Clinical Data:  Left-sided facial and neck swelling, fever  CT NECK WITH CONTRAST, CT MAXILLOFACIAL WITH CONTRAST  Technique:  Multidetector CT imaging of the neck was performed using the standard  protocol following the bolus administration of intravenous contrast.  Multidetector CT imaging of the maxillofacial structures was performed.  Multiplanar CT image reconstructions were also generated.  A small metallic BB was placed on the right temple in order to reliably differentiate right from left.  Contrast: 100 ml Omnipaque 300 IV contrast  CT NECK WITH CONTRAST  Comparison:  Chest CT 07/04/2010  Findings: Findings pertaining to the parotid gland will be discussed in detail below.  Emphysematous changes are noted at the lung apices.  Airway is midline and patent.  Thyroid gland is unremarkable.  Left carotid bulb calcification incidentally noted. A few small presumably reactive submandibular nodes are noted, left greater than right.  Visualized paranasal sinuses are unremarkable. Incomplete left mastoid air cell opacification noted.  IMPRESSION: No acute abnormality in the neck.  Please see comments in dedicated report below regarding left parotid gland.  CT MAXILLOFACIAL WITH CONTRAST  Findings:  There is diffuse stranding / infiltration and enlargement of the left parotid gland, with overlying skin thickening and subcutaneous stranding.  No focal definable rim enhancing fluid collection is seen to suggest abscess formation. There is incomplete opacification of the left mastoid air cells. No osseous destruction or sclerosis is noted. Examination is degraded by patient motion.  Allowing for this, no fracture is identified.  Submandibular glands are normal.  Several small adjacent lymph nodes are noted.  Allowing for the presence of streak artifact from dental amalgam, no definite calcification is seen along the expected course of the left parotid duct to suggest calcification.  IMPRESSION: Swelling and internal stranding of the left parotid gland with overlying soft tissue stranding and skin thickening, likely indicating primary or secondary parotitis with possible overlying cellulitis/local spread of  inflammatory reaction.  Malignant infiltration in the setting of the previously seen right upper lobe spiculated mass would be much less likely.  Lymphomatous involvement or other neoplastic involvement would be also similarly less likely.  Original Report Authenticated By: Harrel Lemon, M.D.   US Abdomen Complete  10/22/2010  *RADIOLOGY REPORT*  Clinical Data:  Hepatomegaly, history of lung cancer  ULTRASOUND ABDOMEN:  Technique:  Sonography of upper abdominal structures was performed.  Comparison:  05/17/2009  Gallbladder:  Normally distended without stones or sonographic Murphy's sign.  Gallbladder wall appears upper normal in thickness. No definite pericholecystic fluid.  Common bile duct:  7 mm diameter, mildly prominent size, potentially related to age, not significantly changed from previous study.  Liver:  Echogenic, likely fatty infiltration, though this can be seen with cirrhosis and certain infiltrative disorders.  Hepatic parenchyma is slightly inhomogeneous without discrete mass or nodule.  Liver appears minimally prominent in size, similar to prior exams.  Hepatopetal portal venous flow.  IVC:  Normal appearance  Pancreas:  Normal appearance  Spleen:  Upper normal size without focal mass, 12.1 cm length  Right kidney:  12.8 cm length. Normal morphology without mass or hydronephrosis.  Left kidney:  12.0 cm  length. Normal morphology without mass or hydronephrosis.  Aorta:  Normal caliber  Other:  No free fluid  IMPRESSION: Probable fatty infiltration of the liver as above, with liver appearing minimally prominent size. No focal hepatic mass or nodularity. Upper normal thickness of gallbladder wall without evidence of gallstones, pericholecystic fluid or sonographic Murphy's sign. Remainder of exam unremarkable.  Original Report Authenticated By: Lollie Marrow, M.D.   Ct Maxillofacial W/cm  10/22/2010  *RADIOLOGY REPORT*  Clinical Data:  Left-sided facial and neck swelling, fever  CT NECK WITH  CONTRAST, CT MAXILLOFACIAL WITH CONTRAST  Technique:  Multidetector CT imaging of the neck was performed using the standard protocol following the bolus administration of intravenous contrast.  Multidetector CT imaging of the maxillofacial structures was performed.  Multiplanar CT image reconstructions were also generated.  A small metallic BB was placed on the right temple in order to reliably differentiate right from left.  Contrast: 100 ml Omnipaque 300 IV contrast  CT NECK WITH CONTRAST  Comparison:  Chest CT 07/04/2010  Findings: Findings pertaining to the parotid gland will be discussed in detail below.  Emphysematous changes are noted at the lung apices.  Airway is midline and patent.  Thyroid gland is unremarkable.  Left carotid bulb calcification incidentally noted. A few small presumably reactive submandibular nodes are noted, left greater than right.  Visualized paranasal sinuses are unremarkable. Incomplete left mastoid air cell opacification noted.  IMPRESSION: No acute abnormality in the neck.  Please see comments in dedicated report below regarding left parotid gland.  CT MAXILLOFACIAL WITH CONTRAST  Findings:  There is diffuse stranding / infiltration and enlargement of the left parotid gland, with overlying skin thickening and subcutaneous stranding.  No focal definable rim enhancing fluid collection is seen to suggest abscess formation. There is incomplete opacification of the left mastoid air cells. No osseous destruction or sclerosis is noted. Examination is degraded by patient motion.  Allowing for this, no fracture is identified.  Submandibular glands are normal.  Several small adjacent lymph nodes are noted.  Allowing for the presence of streak artifact from dental amalgam, no definite calcification is seen along the expected course of the left parotid duct to suggest calcification.  IMPRESSION: Swelling and internal stranding of the left parotid gland with overlying soft tissue stranding and  skin thickening, likely indicating primary or secondary parotitis with possible overlying cellulitis/local spread of inflammatory reaction.  Malignant infiltration in the setting of the previously seen right upper lobe spiculated mass would be much less likely.  Lymphomatous involvement or other neoplastic involvement would be also similarly less likely.  Original Report Authenticated By: Harrel Lemon, M.D.      Medications: I have reviewed the patient's current medications.  Impression: 1. Left pneumonia on chest x-ray. I wonder whether she actually has a right-sided pneumonia on clinical exam. 2. Right lung cancer on previous x-ray and CT scanning. 3. Left parotiditis. 4. COPD. 5. Moderate to severe dementia. 6. Hypertension.     Plan: 1. Continue intravenous antibiotics. 2. Repeat CT chest scan to see if there is progression of lung cancer. 3. I will discuss with family members regarding further care. When she was admitted in May of this year, referral was made to hospice by Dr. Lendell Caprice. I think this is appropriate now.     LOS: 1 day   Shauntea Lok C 10/22/2010, 11:48 AM

## 2010-10-22 NOTE — Consult Note (Signed)
ANTICOAGULATION/ANTIBIOTICS CONSULT NOTE - Initial Consult  Pharmacy Consult for Warfarin, Vancomycin, Maxipime, Levaquin Indication: atrial fibrillation, suspected pna  Patient Measurements: Height: 5\' 4"  (162.6 cm) Weight: 158 lb 1.1 oz (71.7 kg) IBW/kg (Calculated) : 54.7   Vital Signs: Temp: 100.5 F (38.1 C) (08/28 0651) Temp src: Oral (08/28 0651) BP: 143/62 mmHg (08/28 0651) Pulse Rate: 79  (08/28 0651)  Labs:  Basename 10/22/10 0515 10/22/10 0508  HGB -- 9.9*  HCT -- 32.1*  PLT -- 237  APTT -- --  LABPROT -- 33.5*  INR -- 3.23*  HEPARINUNFRC -- --  CREATININE <0.47* --  CRCLEARANCE -- --  CKTOTAL -- --  CKMB -- --  TROPONINI -- --   Medical History: Past Medical History  Diagnosis Date  . Hypertension   . Dementia   . COPD (chronic obstructive pulmonary disease)   . Depression   . Chronic respiratory failure with hypoxia   . Lung mass    Medications:  Scheduled:    . ALPRAZolam  0.5 mg Oral Once  . ALPRAZolam  0.5 mg Oral BID  . budesonide  0.25 mg Nebulization BID  . buPROPion  150 mg Oral Daily  . ceFEPime (MAXIPIME) IV  1 g Intravenous Q8H  . cefTRIAXone (ROCEPHIN) IVPB 1 gram/50 mL D5W  1 g Intravenous Once  . Chlorhexidine Gluconate Cloth  6 each Topical Q0600  . clindamycin  900 mg Intravenous Once  . cycloSPORINE  1 drop Both Eyes BID  . digoxin  250 mcg Oral Daily  . diltiazem  240 mg Oral Daily  . doxepin  20 mg Oral QHS  . furosemide  60 mg Oral Daily  . guaiFENesin  1,200 mg Oral BID  . ipratropium  500 mcg Nebulization Q6H  . levofloxacin  500 mg Intravenous Once  . levofloxacin (LEVAQUIN) IV  750 mg Intravenous Q24H  . lidocaine  1 patch Transdermal Q24H  . metoprolol tartrate  25 mg Oral BID  . mupirocin  1 application Nasal BID  . oxyCODONE  20 mg Oral Q12H  . pantoprazole  80 mg Oral Q1200  . polyethylene glycol  17 g Oral Daily  . primidone  25 mg Oral TID  . senna-docusate  1 tablet Oral QAM  . sertraline  100 mg Oral  Daily  . sertraline  50 mg Oral Daily  . tobramycin-dexamethasone   Both Eyes Q4H while awake  . vancomycin  1,000 mg Intravenous Once  . vancomycin  1,000 mg Intravenous Q12H  . DISCONTD: albuterol  2.5 mg Nebulization Q6H  . DISCONTD: levofloxacin (LEVAQUIN) IV  250 mg Intravenous Once  . DISCONTD: oxyCODONE  20 mg Oral Q12H  . DISCONTD: polyethylene glycol powder  17 g Oral Daily  . DISCONTD: polyethylene glycol powder  17 g Oral Daily  . DISCONTD: tiotropium  18 mcg Inhalation Daily  . DISCONTD: traZODone  50 mg Oral QHS  . DISCONTD: warfarin  10 mg Oral UD  . DISCONTD: warfarin  10.5 mg Oral UD    Assessment: Renal fxn OK (SCr OK) INR supra therapeutic  Goal of Therapy:  Eradicate infection. INR 2-3   Plan:  No Coumadin today Vancomycin 1gm iv q12hrs, check trough tomorrow Maxipime 1gm iv q12hrs levaquin 750mg  iv q24hrs Monitor renal fxn, CBC  Deagan Sevin A 10/22/2010,10:09 AM

## 2010-10-22 NOTE — Progress Notes (Signed)
UR Chart Review Completed  

## 2010-10-22 NOTE — H&P (Signed)
PCP:   Terald Sleeper, MD   Chief Complaint:  Lethargy x 2 days  HPI:  History was constructed by reviewing chart and discussion with EDP, since pt is not oriented to place, time or reason for admission.  77yo CF, [facility] resident,  with dementia, H/O COPD, O2 dependent, Right upper lobe lung mass presumed cancer, declining treatment, has been evaluated past 2 days for increasing lethargy. Had blood work and CXR done which show evidence of left lobar pneumonia and marked leucocytosis, Augmentin and prednisone started, but then sent to ED for further management as lethargy persisted. In ED swelling of neck noted and a CT of neck and facial sinuses was performed; patient noted to be febrile and hospitalist service was called for assistance.    Review of Systems:  The patient denies anorexia, fever, weight loss,, vision loss, decreased hearing, hoarseness, chest pain, syncope, , peripheral edema, balance deficits, hemoptysis, abdominal pain, melena, hematochezia, severe indigestion/heartburn, hematuria, incontinence, genital sores, muscle weakness, suspicious skin lesions, transient blindness, difficulty walking, depression, unusual weight change, abnormal bleeding, enlarged lymph nodes, angioedema, and breast masses.  Past Medical History: Past Medical History  Diagnosis Date  . Hypertension   . Dementia   . COPD (chronic obstructive pulmonary disease)   . Depression   . Chronic respiratory failure with hypoxia   . Lung mass    History reviewed. No pertinent past surgical history.  Medications: Prior to Admission medications   Medication Sig Start Date End Date Taking? Authorizing Provider  ALPRAZolam Prudy Feeler) 0.5 MG tablet Take 0.5 mg by mouth 2 (two) times daily.     Yes Historical Provider, MD  budesonide (PULMICORT) 0.25 MG/2ML nebulizer solution Take 0.25 mg by nebulization 2 (two) times daily.     Yes Historical Provider, MD  buPROPion (WELLBUTRIN XL) 150 MG 24 hr  tablet Take 150 mg by mouth daily.     Yes Historical Provider, MD  cycloSPORINE (RESTASIS) 0.05 % ophthalmic emulsion Place 1 drop into both eyes 2 (two) times daily.     Yes Historical Provider, MD  digoxin (LANOXIN) 0.25 MG tablet Take 250 mcg by mouth daily.     Yes Historical Provider, MD  diltiazem (CARDIZEM CD) 240 MG 24 hr capsule Take 240 mg by mouth daily.     Yes Historical Provider, MD  doxepin (SINEQUAN) 10 MG capsule Take 20 mg by mouth at bedtime.     Yes Historical Provider, MD  esomeprazole (NEXIUM) 40 MG capsule Take 40 mg by mouth daily. On an empty stomach    Yes Historical Provider, MD  furosemide (LASIX) 20 MG tablet Take 60 mg by mouth daily.     Yes Historical Provider, MD  guaiFENesin (MUCINEX) 600 MG 12 hr tablet Take 1,200 mg by mouth 2 (two) times daily.     Yes Historical Provider, MD  ipratropium (ATROVENT) 0.02 % nebulizer solution Take 500 mcg by nebulization every 6 (six) hours.     Yes Historical Provider, MD  levalbuterol (XOPENEX) 1.25 MG/3ML nebulizer solution Take 1 ampule by nebulization every 6 (six) hours as needed.     Yes Historical Provider, MD  lidocaine (LIDODERM) 5 % Place 1 patch onto the skin daily. Apply to shoulder. Remove & Discard patch within 12 hours or as directed by MD    Yes Historical Provider, MD  metoprolol tartrate (LOPRESSOR) 25 MG tablet Take 25 mg by mouth 2 (two) times daily.     Yes Historical Provider, MD  oxyCODONE (OXYCONTIN) 20 MG  12 hr tablet Take 20 mg by mouth every 12 (twelve) hours.     Yes Historical Provider, MD  polyethylene glycol powder (GLYCOLAX/MIRALAX) powder Take 17 g by mouth daily.     Yes Historical Provider, MD  potassium chloride (KLOR-CON) 10 MEQ CR tablet Take 30 mEq by mouth every morning.     Yes Historical Provider, MD  potassium chloride SA (K-DUR,KLOR-CON) 20 MEQ tablet Take 20 mEq by mouth every evening.     Yes Historical Provider, MD  predniSONE (DELTASONE) 10 MG tablet Take 10 mg by mouth daily. With  food    Yes Historical Provider, MD  primidone (MYSOLINE) 50 MG tablet Take 25 mg by mouth 3 (three) times daily. One-half tablet three times daily    Yes Historical Provider, MD  senna-docusate (SENOKOT S) 8.6-50 MG per tablet Take 1 tablet by mouth every morning. **Hold for Diarrhea**    Yes Historical Provider, MD  sertraline (ZOLOFT) 100 MG tablet Take 100 mg by mouth daily. Along with Zoloft 50mg  to equal a total dose of 150mg  daily    Yes Historical Provider, MD  sertraline (ZOLOFT) 50 MG tablet Take 50 mg by mouth daily. Take with Zoloft 100mg  to equal a total daily dose of 150mg  daily    Yes Historical Provider, MD  tiotropium (SPIRIVA) 18 MCG inhalation capsule Place 18 mcg into inhaler and inhale daily.     Yes Historical Provider, MD  traZODone (DESYREL) 50 MG tablet Take 50 mg by mouth at bedtime.     Yes Historical Provider, MD  warfarin (COUMADIN) 10 MG tablet Take 10 mg by mouth as directed. On Mondays, Wednesdays, Fridays, and Sundays. **As Directed on [facility]**    Yes Historical Provider, MD  warfarin (COUMADIN) 10 MG tablet Take 10.5 mg by mouth as directed. On Tuesdays, Thursdays, and Saturdays **As Directed on [facility]**    Yes Historical Provider, MD    Allergies:   Allergies  Allergen Reactions  . Codeine     Social History:  reports that she has quit smoking. Her smoking use included Cigarettes. She has a 60 pack-year smoking history. She does not have any smokeless tobacco history on file. She reports that she does not drink alcohol. Her drug history not on file.  Family History: History reviewed. No pertinent family history.  Physical Exam: Filed Vitals:   10/22/10 0008 10/22/10 0211 10/22/10 0244 10/22/10 0300  BP:  150/74 172/67   Pulse:  110 129   Temp:  99.3 F (37.4 C) 101.3 F (38.5 C)   TempSrc:   Oral   Resp:  18 22   Height:    5\' 4"  (1.626 m)  Weight:    71.7 kg (158 lb 1.1 oz)  SpO2: 94% 93% 90%    General appearance: alert, cooperative, delirious  and moderately obese Head: Normocephalic, without obvious abnormality, atraumatic Eyes: bilateral purulent discharge, conjunctivae/corneas clear. PERRL,  Throat: lips, mucosa, and tongue normal; teeth and gums normal Neck: Left side of face and neck, swollen red and tender; no adenopathy, no thyromegaly  Back: symmetric, no curvature. ROM normal. No CVA tenderness. Resp: rhonchi bilaterally and left basal inspiratory crackles Chest wall: no tenderness Breasts: normal appearance, no masses or tenderness Cardio: tachycardia, regular rythmn GI: abnormal findings:  Morbid ly obese; hepatomegaly, liver edge palpable 10 cm below costal margin; no tenderness Extremities: extremities normal, atraumatic, no cyanosis or edema, Homans sign is negative, no sign of DVT and no ulcers, gangrene or trophic changes Pulses: 2+  and symmetric Skin: Skin color, texture, turgor normal. No rashes or lesions, other than noted in neck; Neurologic: Grossly normal   Labs on Admission:  Labs accompanying patient: WBC 22, Hb 11.2, Plats 240; ANC 18.4, >20% bands  Na 134, K 4.1, CO2 32, Glu 107, BUN 13. Creat 0.55, Ca+ 8.6, Alk Phos 86, Tot Pr 6.8, Alb 3.4.  Radiological Exams on Admission: Dg Chest 2 View  10/21/2010  *RADIOLOGY REPORT*  Clinical Data: Shortness of breath, cough  CHEST - 2 VIEW  Comparison: 07/03/2010 Correlation:  CT chest 07/04/2010  Findings: Enlargement of cardiac silhouette. Calcified tortuous aorta. Pulmonary vascularity normal. Bilateral breast prostheses. Left basilar infiltrate new since prior exam. Underlying emphysematous and bronchitic changes. Remaining lungs grossly clear. Nodular density in the right upper lobe is unchanged, appearance on prior CT worrisome for right upper lobe malignancy. Bones diffusely deep mineralized with scattered end plate spur formation and disc space narrowing.  IMPRESSION: Emphysematous and bronchitic changes with new left basilar infiltrate compatible with  pneumonia. Right upper lobe nodular density worrisome for malignancy based on prior CT appearance, please reference that report of the prior CT exam.  Original Report Authenticated By: Lollie Marrow, M.D.   Ct Soft Tissue Neck W Contrast  10/22/2010  *RADIOLOGY REPORT*  Clinical Data:  Left-sided facial and neck swelling, fever  CT NECK WITH CONTRAST, CT MAXILLOFACIAL WITH CONTRAST  Technique:  Multidetector CT imaging of the neck was performed using the standard protocol following the bolus administration of intravenous contrast.  Multidetector CT imaging of the maxillofacial structures was performed.  Multiplanar CT image reconstructions were also generated.  A small metallic BB was placed on the right temple in order to reliably differentiate right from left.  Contrast: 100 ml Omnipaque 300 IV contrast  CT NECK WITH CONTRAST  Comparison:  Chest CT 07/04/2010  Findings: Findings pertaining to the parotid gland will be discussed in detail below.  Emphysematous changes are noted at the lung apices.  Airway is midline and patent.  Thyroid gland is unremarkable.  Left carotid bulb calcification incidentally noted. A few small presumably reactive submandibular nodes are noted, left greater than right.  Visualized paranasal sinuses are unremarkable. Incomplete left mastoid air cell opacification noted.  IMPRESSION: No acute abnormality in the neck.  Please see comments in dedicated report below regarding left parotid gland.  CT MAXILLOFACIAL WITH CONTRAST  Findings:  There is diffuse stranding / infiltration and enlargement of the left parotid gland, with overlying skin thickening and subcutaneous stranding.  No focal definable rim enhancing fluid collection is seen to suggest abscess formation. There is incomplete opacification of the left mastoid air cells. No osseous destruction or sclerosis is noted. Examination is degraded by patient motion.  Allowing for this, no fracture is identified.  Submandibular glands  are normal.  Several small adjacent lymph nodes are noted.  Allowing for the presence of streak artifact from dental amalgam, no definite calcification is seen along the expected course of the left parotid duct to suggest calcification.  IMPRESSION: Swelling and internal stranding of the left parotid gland with overlying soft tissue stranding and skin thickening, likely indicating primary or secondary parotitis with possible overlying cellulitis/local spread of inflammatory reaction.  Malignant infiltration in the setting of the previously seen right upper lobe spiculated mass would be much less likely.  Lymphomatous involvement or other neoplastic involvement would be also similarly less likely.  Original Report Authenticated By: Harrel Lemon, M.D.   Ct Maxillofacial W/cm  10/22/2010  *  RADIOLOGY REPORT*  Clinical Data:  Left-sided facial and neck swelling, fever  CT NECK WITH CONTRAST, CT MAXILLOFACIAL WITH CONTRAST  Technique:  Multidetector CT imaging of the neck was performed using the standard protocol following the bolus administration of intravenous contrast.  Multidetector CT imaging of the maxillofacial structures was performed.  Multiplanar CT image reconstructions were also generated.  A small metallic BB was placed on the right temple in order to reliably differentiate right from left.  Contrast: 100 ml Omnipaque 300 IV contrast  CT NECK WITH CONTRAST  Comparison:  Chest CT 07/04/2010  Findings: Findings pertaining to the parotid gland will be discussed in detail below.  Emphysematous changes are noted at the lung apices.  Airway is midline and patent.  Thyroid gland is unremarkable.  Left carotid bulb calcification incidentally noted. A few small presumably reactive submandibular nodes are noted, left greater than right.  Visualized paranasal sinuses are unremarkable. Incomplete left mastoid air cell opacification noted.  IMPRESSION: No acute abnormality in the neck.  Please see comments in  dedicated report below regarding left parotid gland.  CT MAXILLOFACIAL WITH CONTRAST  Findings:  There is diffuse stranding / infiltration and enlargement of the left parotid gland, with overlying skin thickening and subcutaneous stranding.  No focal definable rim enhancing fluid collection is seen to suggest abscess formation. There is incomplete opacification of the left mastoid air cells. No osseous destruction or sclerosis is noted. Examination is degraded by patient motion.  Allowing for this, no fracture is identified.  Submandibular glands are normal.  Several small adjacent lymph nodes are noted.  Allowing for the presence of streak artifact from dental amalgam, no definite calcification is seen along the expected course of the left parotid duct to suggest calcification.  IMPRESSION: Swelling and internal stranding of the left parotid gland with overlying soft tissue stranding and skin thickening, likely indicating primary or secondary parotitis with possible overlying cellulitis/local spread of inflammatory reaction.  Malignant infiltration in the setting of the previously seen right upper lobe spiculated mass would be much less likely.  Lymphomatous involvement or other neoplastic involvement would be also similarly less likely.  Original Report Authenticated By: Harrel Lemon, M.D.    Assessment/Plan Present on Admission:  .Parotiditis .Pneumonia .COPD (chronic obstructive pulmonary disease) .A-fib .Dementia .Depression with anxiety .Chronic respiratory failure with hypoxia .GERD (gastroesophageal reflux disease) .HTN (hypertension) Lung mass, presumed cancer Hepatomegaly  PLAN Give broad spectrum antibiotics to cover health-care associated pneumonia in an immunocompromised lady; this antibiotics will also treat acute infectious parotiditis. will hydrate and check lactate and prolactin, but will be cognizant of patients h/o diastolic dysfunction.  Continue nebulization, oxygen  supplementation, anticoagulation, pre pharmacy.  Patient is confirmed DNR.  Arriona Prest 10/22/2010, 5:32 AM

## 2010-10-22 NOTE — ED Notes (Signed)
Report called to unit.  Pt to be admitted to room 301.

## 2010-10-23 ENCOUNTER — Inpatient Hospital Stay (HOSPITAL_COMMUNITY): Payer: No Typology Code available for payment source

## 2010-10-23 LAB — COMPREHENSIVE METABOLIC PANEL
ALT: 11 U/L (ref 0–35)
AST: 20 U/L (ref 0–37)
CO2: 29 mEq/L (ref 19–32)
Calcium: 8.6 mg/dL (ref 8.4–10.5)
Chloride: 96 mEq/L (ref 96–112)
Creatinine, Ser: 0.47 mg/dL — ABNORMAL LOW (ref 0.50–1.10)
GFR calc Af Amer: >60 mL/min (ref >60)
GFR calc non Af Amer: >60 mL/min (ref >60)
Glucose, Bld: 80 mg/dL (ref 70–99)
Sodium: 133 mEq/L — ABNORMAL LOW (ref 135–145)
Total Bilirubin: 0.3 mg/dL (ref 0.3–1.2)

## 2010-10-23 LAB — LEGIONELLA ANTIGEN, URINE: Legionella Antigen, Urine: NEGATIVE

## 2010-10-23 LAB — CBC
HCT: 31.5 % — ABNORMAL LOW (ref 36.0–46.0)
Hemoglobin: 9.7 g/dL — ABNORMAL LOW (ref 12.0–15.0)
MCHC: 30.8 g/dL (ref 30.0–36.0)
MCV: 71.3 fL — ABNORMAL LOW (ref 78.0–100.0)
RDW: 17.3 % — ABNORMAL HIGH (ref 11.5–15.5)

## 2010-10-23 LAB — PROTIME-INR: INR: 3.22 — ABNORMAL HIGH (ref 0.00–1.49)

## 2010-10-23 MED ORDER — IOHEXOL 300 MG/ML  SOLN
80.0000 mL | Freq: Once | INTRAMUSCULAR | Status: AC | PRN
  Administered 2010-10-23: 80 mL via INTRAVENOUS

## 2010-10-23 MED ORDER — SODIUM CHLORIDE 0.9 % IN NEBU
INHALATION_SOLUTION | RESPIRATORY_TRACT | Status: AC
  Filled 2010-10-23: qty 3

## 2010-10-23 NOTE — Consult Note (Signed)
ANTICOAGULATION/ANTIBIOTICS CONSULT NOTE - Initial Consult  Pharmacy Consult for Warfarin, Vancomycin, Maxipime, Levaquin Indication: atrial fibrillation, suspected pna  Patient Measurements: Height: 5\' 4"  (162.6 cm) Weight: 161 lb 9.6 oz (73.3 kg) IBW/kg (Calculated) : 54.7   Vital Signs: Temp: 98.6 F (37 C) (08/29 0153) Temp src: Oral (08/29 0153) BP: 137/74 mmHg (08/29 0606) Pulse Rate: 95  (08/29 0606)  Labs:  Basename 10/23/10 0406 10/22/10 0515 10/22/10 0508  HGB 9.7* -- 9.9*  HCT 31.5* -- 32.1*  PLT 220 -- 237  APTT -- -- --  LABPROT 33.4* -- 33.5*  INR 3.22* -- 3.23*  HEPARINUNFRC -- -- --  CREATININE 0.47* <0.47* --  CRCLEARANCE -- -- --  CKTOTAL -- -- --  CKMB -- -- --  TROPONINI -- -- --   Medical History: Past Medical History  Diagnosis Date  . Hypertension   . Dementia   . COPD (chronic obstructive pulmonary disease)   . Depression   . Chronic respiratory failure with hypoxia   . Lung mass    Assessment: Renal fxn OK (SCr OK) INR supra therapeutic  Goal of Therapy:  Eradicate infection. INR 2-3   Plan:  No Coumadin again today Vancomycin 1gm iv q12hrs, check trough today Maxipime 1gm iv q12hrs levaquin 750mg  iv q24hrs Monitor renal fxn, CBC  Mady Gemma 10/23/2010,12:04 PM

## 2010-10-23 NOTE — Progress Notes (Addendum)
Subjective: This lady was admitted  with a left parotiditis and pneumonia. She also has COPD and a right lung mass, likely malignancy. She is due to have a repeat CT chest scan today. She thinks she is somewhat better.           Physical Exam: Blood pressure 137/74, pulse 95, temperature 98.6 F (37 C), temperature source Oral, resp. rate 18, height 5\' 4"  (1.626 m), weight 73.3 kg (161 lb 9.6 oz), SpO2 93.00%. She looks systemically well and is not toxic or septic. She has a large parotiditis which is very tender and surrounding cellulitis which appears to be extending into her upper chest now. Lung fields show crackles in the right mid and lower zones. Abdomen is soft with no hepatomegaly. She is alert. She is not orientated and time place and person. I think this is probably her baseline demented state.   Investigations: Results for orders placed during the hospital encounter of 10/21/10 (from the past 48 hour(s))  MRSA PCR SCREENING     Status: Abnormal   Collection Time   10/22/10  3:30 AM      Component Value Range Comment   MRSA by PCR POSITIVE (*) NEGATIVE    PRO B NATRIURETIC PEPTIDE     Status: Abnormal   Collection Time   10/22/10  5:07 AM      Component Value Range Comment   BNP, POC 1080.0 (*) 0 - 450 (pg/mL)   CBC     Status: Abnormal   Collection Time   10/22/10  5:08 AM      Component Value Range Comment   WBC 19.4 (*) 4.0 - 10.5 (K/uL)    RBC 4.49  3.87 - 5.11 (MIL/uL)    Hemoglobin 9.9 (*) 12.0 - 15.0 (g/dL)    HCT 16.1 (*) 09.6 - 46.0 (%)    MCV 71.5 (*) 78.0 - 100.0 (fL)    MCH 22.0 (*) 26.0 - 34.0 (pg)    MCHC 30.8  30.0 - 36.0 (g/dL)    RDW 04.5 (*) 40.9 - 15.5 (%)    Platelets 237  150 - 400 (K/uL)   DIFFERENTIAL     Status: Abnormal   Collection Time   10/22/10  5:08 AM      Component Value Range Comment   Neutrophils Relative 84 (*) 43 - 77 (%)    Neutro Abs 16.2 (*) 1.7 - 7.7 (K/uL)    Lymphocytes Relative 7 (*) 12 - 46 (%)    Lymphs Abs 1.4  0.7  - 4.0 (K/uL)    Monocytes Relative 9  3 - 12 (%)    Monocytes Absolute 1.7 (*) 0.1 - 1.0 (K/uL)    Eosinophils Relative 0  0 - 5 (%)    Eosinophils Absolute 0.0  0.0 - 0.7 (K/uL)    Basophils Relative 0  0 - 1 (%)    Basophils Absolute 0.0  0.0 - 0.1 (K/uL)   PROTIME-INR     Status: Abnormal   Collection Time   10/22/10  5:08 AM      Component Value Range Comment   Prothrombin Time 33.5 (*) 11.6 - 15.2 (seconds)    INR 3.23 (*) 0.00 - 1.49    DIGOXIN LEVEL     Status: Normal   Collection Time   10/22/10  5:08 AM      Component Value Range Comment   Digoxin Level 0.9  0.8 - 2.0 (ng/mL)   PROCALCITONIN     Status: Normal  Collection Time   10/22/10  5:15 AM      Component Value Range Comment   Procalcitonin 0.23     BASIC METABOLIC PANEL     Status: Abnormal   Collection Time   10/22/10  5:15 AM      Component Value Range Comment   Sodium 131 (*) 135 - 145 (mEq/L)    Potassium 3.6  3.5 - 5.1 (mEq/L)    Chloride 93 (*) 96 - 112 (mEq/L)    CO2 28  19 - 32 (mEq/L)    Glucose, Bld 95  70 - 99 (mg/dL)    BUN 10  6 - 23 (mg/dL)    Creatinine, Ser <2.13 (*) 0.50 - 1.10 (mg/dL)    Calcium 8.6  8.4 - 10.5 (mg/dL)    GFR calc non Af Amer NOT CALCULATED  >60 (mL/min)    GFR calc Af Amer NOT CALCULATED  >60 (mL/min)   CULTURE, BLOOD (ROUTINE X 2)     Status: Normal (Preliminary result)   Collection Time   10/22/10  5:16 AM      Component Value Range Comment   Specimen Description Blood LEFT ARM 6CC      Special Requests NONE      Culture NO GROWTH <24 HRS      Report Status PENDING     CULTURE, BLOOD (ROUTINE X 2)     Status: Normal (Preliminary result)   Collection Time   10/22/10  5:16 AM      Component Value Range Comment   Specimen Description Blood LEFT HAND 6CC      Special Requests NONE      Culture NO GROWTH <24 HRS      Report Status PENDING     LACTIC ACID, PLASMA     Status: Normal   Collection Time   10/22/10  6:23 AM      Component Value Range Comment   Lactic Acid,  Venous 0.7  0.5 - 2.2 (mmol/L)   STREP PNEUMONIAE URINARY ANTIGEN     Status: Normal   Collection Time   10/22/10 12:14 PM      Component Value Range Comment   Strep Pneumo Urinary Antigen NEGATIVE  NEGATIVE    URINALYSIS, ROUTINE W REFLEX MICROSCOPIC     Status: Abnormal   Collection Time   10/22/10 12:14 PM      Component Value Range Comment   Color, Urine YELLOW  YELLOW     Appearance CLEAR  CLEAR     Specific Gravity, Urine 1.010  1.005 - 1.030     pH 5.5  5.0 - 8.0     Glucose, UA NEGATIVE  NEGATIVE (mg/dL)    Hgb urine dipstick TRACE (*) NEGATIVE     Bilirubin Urine NEGATIVE  NEGATIVE     Ketones, ur NEGATIVE  NEGATIVE (mg/dL)    Protein, ur NEGATIVE  NEGATIVE (mg/dL)    Urobilinogen, UA 0.2  0.0 - 1.0 (mg/dL)    Nitrite NEGATIVE  NEGATIVE     Leukocytes, UA NEGATIVE  NEGATIVE    URINE MICROSCOPIC-ADD ON     Status: Normal   Collection Time   10/22/10 12:14 PM      Component Value Range Comment   RBC / HPF 0-2  <3 (RBC/hpf)   CBC     Status: Abnormal   Collection Time   10/23/10  4:06 AM      Component Value Range Comment   WBC 15.5 (*) 4.0 - 10.5 (K/uL)  RBC 4.42  3.87 - 5.11 (MIL/uL)    Hemoglobin 9.7 (*) 12.0 - 15.0 (g/dL)    HCT 16.1 (*) 09.6 - 46.0 (%)    MCV 71.3 (*) 78.0 - 100.0 (fL)    MCH 21.9 (*) 26.0 - 34.0 (pg)    MCHC 30.8  30.0 - 36.0 (g/dL)    RDW 04.5 (*) 40.9 - 15.5 (%)    Platelets 220  150 - 400 (K/uL)   PROTIME-INR     Status: Abnormal   Collection Time   10/23/10  4:06 AM      Component Value Range Comment   Prothrombin Time 33.4 (*) 11.6 - 15.2 (seconds)    INR 3.22 (*) 0.00 - 1.49    COMPREHENSIVE METABOLIC PANEL     Status: Abnormal   Collection Time   10/23/10  4:06 AM      Component Value Range Comment   Sodium 133 (*) 135 - 145 (mEq/L)    Potassium 3.6  3.5 - 5.1 (mEq/L)    Chloride 96  96 - 112 (mEq/L)    CO2 29  19 - 32 (mEq/L)    Glucose, Bld 80  70 - 99 (mg/dL)    BUN 9  6 - 23 (mg/dL)    Creatinine, Ser 8.11 (*) 0.50 - 1.10  (mg/dL)    Calcium 8.6  8.4 - 10.5 (mg/dL)    Total Protein 6.3  6.0 - 8.3 (g/dL)    Albumin 2.6 (*) 3.5 - 5.2 (g/dL)    AST 20  0 - 37 (U/L)    ALT 11  0 - 35 (U/L)    Alkaline Phosphatase 92  39 - 117 (U/L)    Total Bilirubin 0.3  0.3 - 1.2 (mg/dL)    GFR calc non Af Amer >60  >60 (mL/min)    GFR calc Af Amer >60  >60 (mL/min)    Recent Results (from the past 240 hour(s))  MRSA PCR SCREENING     Status: Abnormal   Collection Time   10/22/10  3:30 AM      Component Value Range Status Comment   MRSA by PCR POSITIVE (*) NEGATIVE  Final   CULTURE, BLOOD (ROUTINE X 2)     Status: Normal (Preliminary result)   Collection Time   10/22/10  5:16 AM      Component Value Range Status Comment   Specimen Description Blood LEFT ARM 6CC   Final    Special Requests NONE   Final    Culture NO GROWTH <24 HRS   Final    Report Status PENDING   Incomplete   CULTURE, BLOOD (ROUTINE X 2)     Status: Normal (Preliminary result)   Collection Time   10/22/10  5:16 AM      Component Value Range Status Comment   Specimen Description Blood LEFT HAND [facility]   Final    Special Requests NONE   Final    Culture NO GROWTH <24 HRS   Final    Report Status PENDING   Incomplete     Dg Chest 2 View  10/21/2010  *RADIOLOGY REPORT*  Clinical Data: Shortness of breath, cough  CHEST - 2 VIEW  Comparison: 07/03/2010 Correlation:  CT chest 07/04/2010  Findings: Enlargement of cardiac silhouette. Calcified tortuous aorta. Pulmonary vascularity normal. Bilateral breast prostheses. Left basilar infiltrate new since prior exam. Underlying emphysematous and bronchitic changes. Remaining lungs grossly clear. Nodular density in the right upper lobe is unchanged, appearance on prior CT  worrisome for right upper lobe malignancy. Bones diffusely deep mineralized with scattered end plate spur formation and disc space narrowing.  IMPRESSION: Emphysematous and bronchitic changes with new left basilar infiltrate compatible with pneumonia.  Right upper lobe nodular density worrisome for malignancy based on prior CT appearance, please reference that report of the prior CT exam.  Original Report Authenticated By: Lollie Marrow, M.D.   Ct Soft Tissue Neck W Contrast  10/22/2010  *RADIOLOGY REPORT*  Clinical Data:  Left-sided facial and neck swelling, fever  CT NECK WITH CONTRAST, CT MAXILLOFACIAL WITH CONTRAST  Technique:  Multidetector CT imaging of the neck was performed using the standard protocol following the bolus administration of intravenous contrast.  Multidetector CT imaging of the maxillofacial structures was performed.  Multiplanar CT image reconstructions were also generated.  A small metallic BB was placed on the right temple in order to reliably differentiate right from left.  Contrast: 100 ml Omnipaque 300 IV contrast  CT NECK WITH CONTRAST  Comparison:  Chest CT 07/04/2010  Findings: Findings pertaining to the parotid gland will be discussed in detail below.  Emphysematous changes are noted at the lung apices.  Airway is midline and patent.  Thyroid gland is unremarkable.  Left carotid bulb calcification incidentally noted. A few small presumably reactive submandibular nodes are noted, left greater than right.  Visualized paranasal sinuses are unremarkable. Incomplete left mastoid air cell opacification noted.  IMPRESSION: No acute abnormality in the neck.  Please see comments in dedicated report below regarding left parotid gland.  CT MAXILLOFACIAL WITH CONTRAST  Findings:  There is diffuse stranding / infiltration and enlargement of the left parotid gland, with overlying skin thickening and subcutaneous stranding.  No focal definable rim enhancing fluid collection is seen to suggest abscess formation. There is incomplete opacification of the left mastoid air cells. No osseous destruction or sclerosis is noted. Examination is degraded by patient motion.  Allowing for this, no fracture is identified.  Submandibular glands are normal.   Several small adjacent lymph nodes are noted.  Allowing for the presence of streak artifact from dental amalgam, no definite calcification is seen along the expected course of the left parotid duct to suggest calcification.  IMPRESSION: Swelling and internal stranding of the left parotid gland with overlying soft tissue stranding and skin thickening, likely indicating primary or secondary parotitis with possible overlying cellulitis/local spread of inflammatory reaction.  Malignant infiltration in the setting of the previously seen right upper lobe spiculated mass would be much less likely.  Lymphomatous involvement or other neoplastic involvement would be also similarly less likely.  Original Report Authenticated By: Harrel Lemon, M.D.   US Abdomen Complete  10/22/2010  *RADIOLOGY REPORT*  Clinical Data:  Hepatomegaly, history of lung cancer  ULTRASOUND ABDOMEN:  Technique:  Sonography of upper abdominal structures was performed.  Comparison:  05/17/2009  Gallbladder:  Normally distended without stones or sonographic Murphy's sign.  Gallbladder wall appears upper normal in thickness. No definite pericholecystic fluid.  Common bile duct:  7 mm diameter, mildly prominent size, potentially related to age, not significantly changed from previous study.  Liver:  Echogenic, likely fatty infiltration, though this can be seen with cirrhosis and certain infiltrative disorders.  Hepatic parenchyma is slightly inhomogeneous without discrete mass or nodule.  Liver appears minimally prominent in size, similar to prior exams.  Hepatopetal portal venous flow.  IVC:  Normal appearance  Pancreas:  Normal appearance  Spleen:  Upper normal size without focal mass, 12.1 cm length  Right kidney:  12.8 cm length. Normal morphology without mass or hydronephrosis.  Left kidney:  12.0 cm length. Normal morphology without mass or hydronephrosis.  Aorta:  Normal caliber  Other:  No free fluid  IMPRESSION: Probable fatty infiltration  of the liver as above, with liver appearing minimally prominent size. No focal hepatic mass or nodularity. Upper normal thickness of gallbladder wall without evidence of gallstones, pericholecystic fluid or sonographic Murphy's sign. Remainder of exam unremarkable.  Original Report Authenticated By: Lollie Marrow, M.D.   Ct Maxillofacial W/cm  10/22/2010  *RADIOLOGY REPORT*  Clinical Data:  Left-sided facial and neck swelling, fever  CT NECK WITH CONTRAST, CT MAXILLOFACIAL WITH CONTRAST  Technique:  Multidetector CT imaging of the neck was performed using the standard protocol following the bolus administration of intravenous contrast.  Multidetector CT imaging of the maxillofacial structures was performed.  Multiplanar CT image reconstructions were also generated.  A small metallic BB was placed on the right temple in order to reliably differentiate right from left.  Contrast: 100 ml Omnipaque 300 IV contrast  CT NECK WITH CONTRAST  Comparison:  Chest CT 07/04/2010  Findings: Findings pertaining to the parotid gland will be discussed in detail below.  Emphysematous changes are noted at the lung apices.  Airway is midline and patent.  Thyroid gland is unremarkable.  Left carotid bulb calcification incidentally noted. A few small presumably reactive submandibular nodes are noted, left greater than right.  Visualized paranasal sinuses are unremarkable. Incomplete left mastoid air cell opacification noted.  IMPRESSION: No acute abnormality in the neck.  Please see comments in dedicated report below regarding left parotid gland.  CT MAXILLOFACIAL WITH CONTRAST  Findings:  There is diffuse stranding / infiltration and enlargement of the left parotid gland, with overlying skin thickening and subcutaneous stranding.  No focal definable rim enhancing fluid collection is seen to suggest abscess formation. There is incomplete opacification of the left mastoid air cells. No osseous destruction or sclerosis is noted.  Examination is degraded by patient motion.  Allowing for this, no fracture is identified.  Submandibular glands are normal.  Several small adjacent lymph nodes are noted.  Allowing for the presence of streak artifact from dental amalgam, no definite calcification is seen along the expected course of the left parotid duct to suggest calcification.  IMPRESSION: Swelling and internal stranding of the left parotid gland with overlying soft tissue stranding and skin thickening, likely indicating primary or secondary parotitis with possible overlying cellulitis/local spread of inflammatory reaction.  Malignant infiltration in the setting of the previously seen right upper lobe spiculated mass would be much less likely.  Lymphomatous involvement or other neoplastic involvement would be also similarly less likely.  Original Report Authenticated By: Harrel Lemon, M.D.      Medications: I have reviewed the patient's current medications.  Impression: 1. Left pneumonia on chest x-ray. I wonder whether she actually has a right-sided pneumonia on clinical exam. 2. Right lung cancer on previous x-ray and CT scanning. 3. Left parotiditis with surrounding cellulitis in the left neck and upper left chest. 4. COPD. 5. Moderate to severe dementia. 6. Hypertension.     Plan: 1. Continue intravenous triple antibiotics. 2. Repeat CT chest scan to see if there is progression of lung cancer. 3. I will discuss with family members regarding further care. When she was admitted in May of this year, referral was made to hospice by Dr. Lendell Caprice. I think this is appropriate now. Today, the patient is told  me that she really does not wish further aggressive treatment. I have told her that we should wait for the results of the CT chest scan and then discuss further.  Addendum: I had a telephone conversation with patient's 2 sons. We discussed her pathology and likely prognosis. The repeat CT chest scan today showed  presence of a spiculated mass in the right upper lobe, interval increase in the size of a left upper lobe pulmonary nodule to almost 1 cm in diameter and also the presence of new enlarged subcarina lymph node. This is all worrisome for lung cancer with metastatic disease. The sons wish to continue antibiotics for the time being to see if there is a response. I think this is appropriate. If she does not improve in the next few days, the sons would be open to discuss comfort care measures at this time.     LOS: 2 days   Deonte Otting C 10/23/2010, 10:35 AM

## 2010-10-24 ENCOUNTER — Encounter (HOSPITAL_COMMUNITY): Payer: Self-pay | Admitting: Internal Medicine

## 2010-10-24 DIAGNOSIS — D649 Anemia, unspecified: Secondary | ICD-10-CM

## 2010-10-24 HISTORY — DX: Anemia, unspecified: D64.9

## 2010-10-24 LAB — COMPREHENSIVE METABOLIC PANEL
Albumin: 2.5 g/dL — ABNORMAL LOW (ref 3.5–5.2)
Alkaline Phosphatase: 78 U/L (ref 39–117)
BUN: 8 mg/dL (ref 6–23)
Calcium: 8.3 mg/dL — ABNORMAL LOW (ref 8.4–10.5)
Creatinine, Ser: <0.47 mg/dL — ABNORMAL LOW (ref 0.50–1.10)
Glucose, Bld: 83 mg/dL (ref 70–99)
Total Bilirubin: 0.2 mg/dL — ABNORMAL LOW (ref 0.3–1.2)
Total Protein: 6 g/dL (ref 6.0–8.3)

## 2010-10-24 LAB — PROTIME-INR: INR: 2.45 — ABNORMAL HIGH (ref 0.00–1.49)

## 2010-10-24 LAB — CBC
HCT: 32.6 % — ABNORMAL LOW (ref 36.0–46.0)
Hemoglobin: 9.9 g/dL — ABNORMAL LOW (ref 12.0–15.0)
MCH: 21.9 pg — ABNORMAL LOW (ref 26.0–34.0)
MCHC: 30.4 g/dL (ref 30.0–36.0)
RDW: 17.2 % — ABNORMAL HIGH (ref 11.5–15.5)

## 2010-10-24 MED ORDER — WARFARIN SODIUM 10 MG PO TABS
10.0000 mg | ORAL_TABLET | Freq: Once | ORAL | Status: AC
  Administered 2010-10-24: 10 mg via ORAL
  Filled 2010-10-24: qty 1

## 2010-10-24 MED ORDER — POLYETHYLENE GLYCOL 3350 17 G PO PACK
17.0000 g | PACK | Freq: Every day | ORAL | Status: DC
  Administered 2010-10-24: 17 g via ORAL
  Administered 2010-10-26: 10:00:00 via ORAL
  Filled 2010-10-24 (×2): qty 1

## 2010-10-24 MED ORDER — POTASSIUM CHLORIDE CRYS ER 20 MEQ PO TBCR
20.0000 meq | EXTENDED_RELEASE_TABLET | Freq: Every day | ORAL | Status: AC
  Administered 2010-10-24: 20 meq via ORAL

## 2010-10-24 MED ORDER — VANCOMYCIN HCL 1000 MG IV SOLR
1250.0000 mg | Freq: Two times a day (BID) | INTRAVENOUS | Status: DC
  Administered 2010-10-24 – 2010-10-26 (×4): 1250 mg via INTRAVENOUS
  Filled 2010-10-24 (×8): qty 1250

## 2010-10-24 NOTE — Consult Note (Signed)
ANTICOAGULATION/ANTIBIOTICS CONSULT NOTE -    Pharmacy Consult for Warfarin, Vancomycin, Maxipime, Levaquin Indication: atrial fibrillation, suspected pna  Patient Measurements: Height: 5\' 4"  (162.6 cm) Weight: 164 lb 14.5 oz (74.8 kg) IBW/kg (Calculated) : 54.7   Vital Signs: Temp: 98 F (36.7 C) (08/30 0400) Temp src: Oral (08/30 0400) BP: 118/61 mmHg (08/30 0400) Pulse Rate: 65  (08/30 0400)  Labs: Results for DHWANI, VENKATESH (MRN [id number]) as of 10/24/2010 09:11  Ref. Range 10/23/2010 17:56  Vancomycin Tr Latest Range: 10.0-20.0 ug/mL 10.1    Basename 10/24/10 0441 10/23/10 0406 10/22/10 0515 10/22/10 0508  HGB 9.9* 9.7* -- --  HCT 32.6* 31.5* -- 32.1*  PLT 221 220 -- 237  APTT -- -- -- --  LABPROT 27.0* 33.4* -- 33.5*  INR 2.45* 3.22* -- 3.23*  HEPARINUNFRC -- -- -- --  CREATININE <0.47* 0.47* <0.47* --  CRCLEARANCE -- -- -- --  CKTOTAL -- -- -- --  CKMB -- -- -- --  TROPONINI -- -- -- --   Medical History: Past Medical History  Diagnosis Date  . Hypertension   . Dementia   . COPD (chronic obstructive pulmonary disease)   . Depression   . Chronic respiratory failure with hypoxia   . Lung mass   . Anemia 10/24/2010   Assessment: Renal fxn OK (SCr OK) INR within target today. Vancomycin target low for PNA.  Goal of Therapy:  Eradicate infection. INR 2-3   Plan:  Coumadin 10mg  today Vancomycin 1250 mg iv every 12 hours. Maxipime 1gm iv q12hrs levaquin 750mg  iv q24hrs Monitor renal fxn, CBC  Valls, Francisco J 10/24/2010,9:10 AM

## 2010-10-24 NOTE — Progress Notes (Addendum)
Subjective: The patient is currently lying in bed. She says that she is feeling a little better. She still has some discomfort over the left side of her face.      Physical Exam: Blood pressure 118/61, pulse 65, temperature 98 F (36.7 C), temperature source Oral, resp. rate 18, height 5\' 4"  (1.626 m), weight 74.8 kg (164 lb 14.5 oz), SpO2 93.00%. Face: Enlarged indurated area over the left face, with a scant amount of erythema, moderately tender. Lungs: Clear anteriorly, occasional crackles in the bases. Breathing nonlabored. Heart: Irregular irregular, with a soft systolic murmur. Abdomen: Obese positive bowel sounds, soft, nontender, nondistended. Extremities: Trace of pedal edema. Neurologic: She is alert and oriented to herself and place. Her speech is very clear. She follows commands well. Psychological: Her affect is sad. She asks about the results of the CT scan and I informed her. She is aware that she probably has terminal cancer and she does not want further evaluation and/or treatment. She has thought about hospice, but would like to think about it more. She says that she is radiate to me  God. She says that she is tired of suffering.   Investigations: Results for orders placed during the hospital encounter of 10/21/10 (from the past 48 hour(s))  LEGIONELLA ANTIGEN, URINE     Status: Normal   Collection Time   10/22/10 12:14 PM      Component Value Range Comment   Specimen Description URINE, CATHETERIZED      Special Requests NONE      Legionella Antigen, Urine Negative for Legionella pneumophilia serogroup 1      Report Status 10/23/2010 FINAL     STREP PNEUMONIAE URINARY ANTIGEN     Status: Normal   Collection Time   10/22/10 12:14 PM      Component Value Range Comment   Strep Pneumo Urinary Antigen NEGATIVE  NEGATIVE    URINALYSIS, ROUTINE W REFLEX MICROSCOPIC     Status: Abnormal   Collection Time   10/22/10 12:14 PM      Component Value Range Comment   Color, Urine  YELLOW  YELLOW     Appearance CLEAR  CLEAR     Specific Gravity, Urine 1.010  1.005 - 1.030     pH 5.5  5.0 - 8.0     Glucose, UA NEGATIVE  NEGATIVE (mg/dL)    Hgb urine dipstick TRACE (*) NEGATIVE     Bilirubin Urine NEGATIVE  NEGATIVE     Ketones, ur NEGATIVE  NEGATIVE (mg/dL)    Protein, ur NEGATIVE  NEGATIVE (mg/dL)    Urobilinogen, UA 0.2  0.0 - 1.0 (mg/dL)    Nitrite NEGATIVE  NEGATIVE     Leukocytes, UA NEGATIVE  NEGATIVE    URINE MICROSCOPIC-ADD ON     Status: Normal   Collection Time   10/22/10 12:14 PM      Component Value Range Comment   RBC / HPF 0-2  <3 (RBC/hpf)   CBC     Status: Abnormal   Collection Time   10/23/10  4:06 AM      Component Value Range Comment   WBC 15.5 (*) 4.0 - 10.5 (K/uL)    RBC 4.42  3.87 - 5.11 (MIL/uL)    Hemoglobin 9.7 (*) 12.0 - 15.0 (g/dL)    HCT 21.3 (*) 08.6 - 46.0 (%)    MCV 71.3 (*) 78.0 - 100.0 (fL)    MCH 21.9 (*) 26.0 - 34.0 (pg)    MCHC 30.8  30.0 -  36.0 (g/dL)    RDW 40.9 (*) 81.1 - 15.5 (%)    Platelets 220  150 - 400 (K/uL)   PROTIME-INR     Status: Abnormal   Collection Time   10/23/10  4:06 AM      Component Value Range Comment   Prothrombin Time 33.4 (*) 11.6 - 15.2 (seconds)    INR 3.22 (*) 0.00 - 1.49    COMPREHENSIVE METABOLIC PANEL     Status: Abnormal   Collection Time   10/23/10  4:06 AM      Component Value Range Comment   Sodium 133 (*) 135 - 145 (mEq/L)    Potassium 3.6  3.5 - 5.1 (mEq/L)    Chloride 96  96 - 112 (mEq/L)    CO2 29  19 - 32 (mEq/L)    Glucose, Bld 80  70 - 99 (mg/dL)    BUN 9  6 - 23 (mg/dL)    Creatinine, Ser 9.14 (*) 0.50 - 1.10 (mg/dL)    Calcium 8.6  8.4 - 10.5 (mg/dL)    Total Protein 6.3  6.0 - 8.3 (g/dL)    Albumin 2.6 (*) 3.5 - 5.2 (g/dL)    AST 20  0 - 37 (U/L)    ALT 11  0 - 35 (U/L)    Alkaline Phosphatase 92  39 - 117 (U/L)    Total Bilirubin 0.3  0.3 - 1.2 (mg/dL)    GFR calc non Af Amer >60  >60 (mL/min)    GFR calc Af Amer >60  >60 (mL/min)   VANCOMYCIN, TROUGH      Status: Normal   Collection Time   10/23/10  5:56 PM      Component Value Range Comment   Vancomycin Tr 10.1  10.0 - 20.0 (ug/mL)   PROTIME-INR     Status: Abnormal   Collection Time   10/24/10  4:41 AM      Component Value Range Comment   Prothrombin Time 27.0 (*) 11.6 - 15.2 (seconds)    INR 2.45 (*) 0.00 - 1.49    CBC     Status: Abnormal   Collection Time   10/24/10  4:41 AM      Component Value Range Comment   WBC 13.1 (*) 4.0 - 10.5 (K/uL)    RBC 4.52  3.87 - 5.11 (MIL/uL)    Hemoglobin 9.9 (*) 12.0 - 15.0 (g/dL)    HCT 78.2 (*) 95.6 - 46.0 (%)    MCV 72.1 (*) 78.0 - 100.0 (fL)    MCH 21.9 (*) 26.0 - 34.0 (pg)    MCHC 30.4  30.0 - 36.0 (g/dL)    RDW 21.3 (*) 08.6 - 15.5 (%)    Platelets 221  150 - 400 (K/uL)   COMPREHENSIVE METABOLIC PANEL     Status: Abnormal   Collection Time   10/24/10  4:41 AM      Component Value Range Comment   Sodium 133 (*) 135 - 145 (mEq/L)    Potassium 3.4 (*) 3.5 - 5.1 (mEq/L)    Chloride 95 (*) 96 - 112 (mEq/L)    CO2 29  19 - 32 (mEq/L)    Glucose, Bld 83  70 - 99 (mg/dL)    BUN 8  6 - 23 (mg/dL)    Creatinine, Ser <5.78 (*) 0.50 - 1.10 (mg/dL)    Calcium 8.3 (*) 8.4 - 10.5 (mg/dL)    Total Protein 6.0  6.0 - 8.3 (g/dL)    Albumin 2.5 (*)  3.5 - 5.2 (g/dL)    AST 22  0 - 37 (U/L)    ALT 13  0 - 35 (U/L)    Alkaline Phosphatase 78  39 - 117 (U/L)    Total Bilirubin 0.2 (*) 0.3 - 1.2 (mg/dL)    GFR calc non Af Amer NOT CALCULATED  >60 (mL/min)    GFR calc Af Amer NOT CALCULATED  >60 (mL/min)    Recent Results (from the past 240 hour(s))  MRSA PCR SCREENING     Status: Abnormal   Collection Time   10/22/10  3:30 AM      Component Value Range Status Comment   MRSA by PCR POSITIVE (*) NEGATIVE  Final   CULTURE, BLOOD (ROUTINE X 2)     Status: Normal (Preliminary result)   Collection Time   10/22/10  5:16 AM      Component Value Range Status Comment   Specimen Description BLOOD LEFT ARM   Final    Special Requests BOTTLES DRAWN AEROBIC  AND ANAEROBIC 6CC   Final    Culture NO GROWTH 1 DAY   Final    Report Status PENDING   Incomplete   CULTURE, BLOOD (ROUTINE X 2)     Status: Normal (Preliminary result)   Collection Time   10/22/10  5:16 AM      Component Value Range Status Comment   Specimen Description BLOOD LEFT HAND   Final    Special Requests BOTTLES DRAWN AEROBIC AND ANAEROBIC 6CC   Final    Culture NO GROWTH 1 DAY   Final    Report Status PENDING   Incomplete     Ct Chest W Contrast  10/23/2010  *RADIOLOGY REPORT*  Clinical Data: Follow up right lung nodule, history pneumonia, dementia, hypertension, COPD, GERD, atrial fibrillation, COPD  CT CHEST WITH CONTRAST  Technique:  Multidetector CT imaging of the chest was performed following the standard protocol during bolus administration of intravenous contrast. Sagittal and coronal MPR images reconstructed from axial data set.  Contrast: 80 ml Omnipaque 300 IV.  Comparison: 07/04/2010 Correlation:  Chest radiograph 10/21/2010  Findings: Bilateral breast prostheses showing capsular calcification and intracapsular rupture. Extensive atherosclerotic calcifications aorta, less in coronary arteries. Visualized portion of upper abdomen unremarkable. 2.0 x 1.1 cm diameter subcarinal lymph node increase in size since previous exam. Additional mediastinal nodes appear unchanged. The tiny left pleural effusion. Left lower lobe consolidation. Atelectasis medial right lung base. Scattered peribronchial thickening.  Left upper lobe nodule, 9 x 8 mm image 27, previously 4 mm diameter. Spiculated right upper lobe density 11 x 10 x 9 mm in size, smaller than on previous exam though the appearance is still worrisome. Emphysematous changes in upper lobes. No additional pulmonary mass or nodule. No pneumothorax. Multilevel degenerative disc disease changes thoracic spine.  IMPRESSION: Previously identified spiculated mass in right upper lobe has decreased in size since previous study, though the  characteristics of the lesion remain worrisome. Recommend attention on follow-up exams. Significant interval increase in size of a left upper lobe pulmonary nodule from 4 mm to 9 x 8 mm in size, cannot exclude metastatic disease. Left lower lobe consolidation and tiny pleural effusion. New enlarged subcarinal lymph node.  Original Report Authenticated By: Lollie Marrow, M.D.   US Abdomen Complete  10/22/2010  *RADIOLOGY REPORT*  Clinical Data:  Hepatomegaly, history of lung cancer  ULTRASOUND ABDOMEN:  Technique:  Sonography of upper abdominal structures was performed.  Comparison:  05/17/2009  Gallbladder:  Normally  distended without stones or sonographic Murphy's sign.  Gallbladder wall appears upper normal in thickness. No definite pericholecystic fluid.  Common bile duct:  7 mm diameter, mildly prominent size, potentially related to age, not significantly changed from previous study.  Liver:  Echogenic, likely fatty infiltration, though this can be seen with cirrhosis and certain infiltrative disorders.  Hepatic parenchyma is slightly inhomogeneous without discrete mass or nodule.  Liver appears minimally prominent in size, similar to prior exams.  Hepatopetal portal venous flow.  IVC:  Normal appearance  Pancreas:  Normal appearance  Spleen:  Upper normal size without focal mass, 12.1 cm length  Right kidney:  12.8 cm length. Normal morphology without mass or hydronephrosis.  Left kidney:  12.0 cm length. Normal morphology without mass or hydronephrosis.  Aorta:  Normal caliber  Other:  No free fluid  IMPRESSION: Probable fatty infiltration of the liver as above, with liver appearing minimally prominent size. No focal hepatic mass or nodularity. Upper normal thickness of gallbladder wall without evidence of gallstones, pericholecystic fluid or sonographic Murphy's sign. Remainder of exam unremarkable.  Original Report Authenticated By: Lollie Marrow, M.D.      Medications: I have reviewed the patient's  current medications.  Impression:  1. Left lower lobe pneumonia. She is currently receiving vancomycin, cefepime, and Levaquin. Her white blood cell count is improving. She is currently afebrile. 2. Right long spiculated mass, very likely a malignancy and associated left-sided pulmonary nodule, which has increased in size, likely a metastatic lesion. The patient acknowledges that she probably does have cancer. She is a DO NOT RESUSCITATE which she acknowledges as well. She is considering hospice. 3. Left-sided parotiditis/cellulitis. She still has quite a bit of induration and swelling and associated pain. She is being treated with the antibiotics above. 4. COPD with evidence of emphysema on CT scan of the chest. She is on Pulmicort nebulization and Atrovent nebulization. 5. Chronic atrial fibrillation, chronically anticoagulated. Her heart rate is well controlled. Her INR is therapeutic. 6. Hypertension, stable. 7. Hypokalemia. 8. Mild hyponatremia. 9. Chronic constipation. 10. Anemia, likely secondary to chronic disease.   Plan: 1.Will narrow antibiotics to vancomycin and cefepime only in the a.m. Replete potassium chloride. Add MiraLAX for constipation. Continue gentle IV fluids, and follow serum sodium. Physical therapy consultation Hospice referral when patient is ready. Discontinue Foley catheter in the a.m.    LOS: 3 days   FISHER,DENISE 10/24/2010, 9:34 AM

## 2010-10-24 NOTE — Progress Notes (Signed)
Physical Therapy Evaluation Patient Name: Madison Sanford [id number]'[id number] Date: 10/24/2010 Problem List:  Patient Active Problem List  Diagnoses  . ARTHRITIS, SHOULDER  . Parotiditis  . Pneumonia  . Lung cancer  . COPD (chronic obstructive pulmonary disease)  . A-fib  . Dementia  . Depression with anxiety  . Chronic respiratory failure with hypoxia  . GERD (gastroesophageal reflux disease)  . HTN (hypertension)  . Anemia   Past Medical History:  Past Medical History  Diagnosis Date  . Hypertension   . Dementia   . COPD (chronic obstructive pulmonary disease)   . Depression   . Chronic respiratory failure with hypoxia   . Lung mass   . Anemia 10/24/2010   Past Surgical History: History reviewed. No pertinent past surgical history.  Precautions/Restrictions  Precautions Precautions: Fall Required Braces or Orthoses: No Restrictions Weight Bearing Restrictions: No Prior Functioning  Home Living Type of Home: Skilled Nursing Facility Receives Help From: Personal care attendant   Cognition Cognition Arousal/Alertness: Awake/alert Overall Cognitive Status: Impaired (no understanding of medical situation or where she lives) History of Cognitive Impairment: Appears at baseline functioning Orientation Level: Disoriented to person Sensation/Coordination Sensation Light Touch: Appears Intact Extremity Assessment RLE Assessment RLE Assessment: Within Functional Limits LLE Assessment LLE Assessment: Within Functional Limits Mobility (including Balance) Bed Mobility Bed Mobility: Yes Supine to Sit: 6: Modified independent (Device/Increase time) Sitting - Scoot to Edge of Bed: 6: Modified independent (Device/Increase time) Transfers Transfers: Yes Sit to Stand: 4: Min assist Sit to Stand Details (indicate cue type and reason): tends to fall backward-needs stabilization Stand to Sit: 5: Supervision Ambulation/Gait Ambulation/Gait: Yes Ambulation/Gait Assistance: 5:  Supervision Ambulation Distance (Feet): 25 Feet Assistive device: Rolling walker Gait Pattern: Within Functional Limits Stairs: No Wheelchair Mobility Wheelchair Mobility: No  Posture/Postural Control Posture/Postural Control: No significant limitations Balance Balance Assessed: Yes Dynamic Sitting Balance Dynamic Sitting - Level of Assistance: 7: Independent Static Standing Balance Static Standing - Level of Assistance: 4: Min assist Exercise     End of Session PT - End of Session Equipment Utilized During Treatment: Gait belt Activity Tolerance: Patient limited by fatigue PT Assessment/Plan/Recommendation PT Assessment Clinical Impression Statement: pleasant but demented pt who appears to be close to functional baseline PT Recommendation/Assessment: Patent does not need any further PT services No Skilled PT: Patient will have necessary level of assist by caregiver at discharge PT Goals    [name] 10/24/2010, 12:35 PM

## 2010-10-25 LAB — CBC
HCT: 32.2 % — ABNORMAL LOW (ref 36.0–46.0)
MCHC: 30.4 g/dL (ref 30.0–36.0)
MCV: 71.1 fL — ABNORMAL LOW (ref 78.0–100.0)
RDW: 16.9 % — ABNORMAL HIGH (ref 11.5–15.5)

## 2010-10-25 LAB — BASIC METABOLIC PANEL
BUN: 5 mg/dL — ABNORMAL LOW (ref 6–23)
Creatinine, Ser: <0.47 mg/dL — ABNORMAL LOW (ref 0.50–1.10)
Glucose, Bld: 86 mg/dL (ref 70–99)

## 2010-10-25 MED ORDER — POTASSIUM CHLORIDE CRYS ER 20 MEQ PO TBCR: 20.0000 meq | EXTENDED_RELEASE_TABLET | Freq: Every day | ORAL | Status: DC

## 2010-10-25 MED ORDER — POTASSIUM CHLORIDE CRYS ER 20 MEQ PO TBCR
30.0000 meq | EXTENDED_RELEASE_TABLET | Freq: Two times a day (BID) | ORAL | Status: DC
  Administered 2010-10-25 – 2010-10-26 (×2): 30 meq via ORAL
  Filled 2010-10-25 (×2): qty 1
  Filled 2010-10-25: qty 3

## 2010-10-25 MED ORDER — LEVOFLOXACIN 250 MG PO TABS
750.0000 mg | ORAL_TABLET | Freq: Every day | ORAL | Status: DC
  Administered 2010-10-25: 750 mg via ORAL
  Filled 2010-10-25: qty 1

## 2010-10-25 MED ORDER — WARFARIN SODIUM 7.5 MG PO TABS
7.5000 mg | ORAL_TABLET | Freq: Once | ORAL | Status: AC
  Administered 2010-10-25: 7.5 mg via ORAL
  Filled 2010-10-25: qty 1

## 2010-10-25 MED ORDER — TOBRAMYCIN-DEXAMETHASONE 0.3-0.1 % OP OINT
TOPICAL_OINTMENT | OPHTHALMIC | Status: DC
  Administered 2010-10-25 – 2010-10-26 (×3): via OPHTHALMIC
  Filled 2010-10-25: qty 3.5

## 2010-10-25 MED ORDER — LIDOCAINE 5 % EX PTCH
MEDICATED_PATCH | CUTANEOUS | Status: AC
  Filled 2010-10-25: qty 1

## 2010-10-25 NOTE — Progress Notes (Signed)
Subjective: She has no new complaints. She still in knowledge is some discomfort on the left side of her face. She says that it does not interfere with her eating.   Physical Exam: Blood pressure 136/66, pulse 90, temperature 98.3 F (36.8 C), temperature source Oral, resp. rate 20, height 5\' 4"  (1.626 m), weight 75.3 kg (166 lb 0.1 oz), SpO2 89.00%. Face: Enlarged indurated area over the left face, with a scant amount of erythema, moderately tender. Lungs: Occasional crackles in the upper lobes, cleared with coughing. Breathing nonlabored. Heart: Irregular irregular, with a soft systolic murmur. Abdomen: Obese positive bowel sounds, soft, nontender, nondistended. Extremities: Trace of pedal edema. Neurologic: She is alert and oriented to herself and place. Her speech is clear. She follows commands well. Psychological: See  note yesterday. She is smiling this morning, unlike yesterday.   Investigations: Results for orders placed during the hospital encounter of 10/21/10 (from the past 48 hour(s))  VANCOMYCIN, TROUGH     Status: Normal   Collection Time   10/23/10  5:56 PM      Component Value Range Comment   Vancomycin Tr 10.1  10.0 - 20.0 (ug/mL)   PROTIME-INR     Status: Abnormal   Collection Time   10/24/10  4:41 AM      Component Value Range Comment   Prothrombin Time 27.0 (*) 11.6 - 15.2 (seconds)    INR 2.45 (*) 0.00 - 1.49    CBC     Status: Abnormal   Collection Time   10/24/10  4:41 AM      Component Value Range Comment   WBC 13.1 (*) 4.0 - 10.5 (K/uL)    RBC 4.52  3.87 - 5.11 (MIL/uL)    Hemoglobin 9.9 (*) 12.0 - 15.0 (g/dL)    HCT 40.9 (*) 81.1 - 46.0 (%)    MCV 72.1 (*) 78.0 - 100.0 (fL)    MCH 21.9 (*) 26.0 - 34.0 (pg)    MCHC 30.4  30.0 - 36.0 (g/dL)    RDW 91.4 (*) 78.2 - 15.5 (%)    Platelets 221  150 - 400 (K/uL)   COMPREHENSIVE METABOLIC PANEL     Status: Abnormal   Collection Time   10/24/10  4:41 AM      Component Value Range Comment   Sodium 133 (*) 135  - 145 (mEq/L)    Potassium 3.4 (*) 3.5 - 5.1 (mEq/L)    Chloride 95 (*) 96 - 112 (mEq/L)    CO2 29  19 - 32 (mEq/L)    Glucose, Bld 83  70 - 99 (mg/dL)    BUN 8  6 - 23 (mg/dL)    Creatinine, Ser <9.56 (*) 0.50 - 1.10 (mg/dL)    Calcium 8.3 (*) 8.4 - 10.5 (mg/dL)    Total Protein 6.0  6.0 - 8.3 (g/dL)    Albumin 2.5 (*) 3.5 - 5.2 (g/dL)    AST 22  0 - 37 (U/L)    ALT 13  0 - 35 (U/L)    Alkaline Phosphatase 78  39 - 117 (U/L)    Total Bilirubin 0.2 (*) 0.3 - 1.2 (mg/dL)    GFR calc non Af Amer NOT CALCULATED  >60 (mL/min)    GFR calc Af Amer NOT CALCULATED  >60 (mL/min)   PROTIME-INR     Status: Abnormal   Collection Time   10/25/10  5:32 AM      Component Value Range Comment   Prothrombin Time 27.1 (*) 11.6 -  15.2 (seconds)    INR 2.46 (*) 0.00 - 1.49    CBC     Status: Abnormal   Collection Time   10/25/10  5:32 AM      Component Value Range Comment   WBC 9.5  4.0 - 10.5 (K/uL)    RBC 4.53  3.87 - 5.11 (MIL/uL)    Hemoglobin 9.8 (*) 12.0 - 15.0 (g/dL)    HCT 16.1 (*) 09.6 - 46.0 (%)    MCV 71.1 (*) 78.0 - 100.0 (fL)    MCH 21.6 (*) 26.0 - 34.0 (pg)    MCHC 30.4  30.0 - 36.0 (g/dL)    RDW 04.5 (*) 40.9 - 15.5 (%)    Platelets 239  150 - 400 (K/uL)   BASIC METABOLIC PANEL     Status: Abnormal   Collection Time   10/25/10  5:32 AM      Component Value Range Comment   Sodium 133 (*) 135 - 145 (mEq/L)    Potassium 3.0 (*) 3.5 - 5.1 (mEq/L)    Chloride 97  96 - 112 (mEq/L)    CO2 29  19 - 32 (mEq/L)    Glucose, Bld 86  70 - 99 (mg/dL)    BUN 5 (*) 6 - 23 (mg/dL)    Creatinine, Ser <8.11 (*) 0.50 - 1.10 (mg/dL)    Calcium 8.3 (*) 8.4 - 10.5 (mg/dL)    GFR calc non Af Amer NOT CALCULATED  >60 (mL/min)    GFR calc Af Amer NOT CALCULATED  >60 (mL/min)   MAGNESIUM     Status: Normal   Collection Time   10/25/10  5:32 AM      Component Value Range Comment   Magnesium 1.9  1.5 - 2.5 (mg/dL)    Recent Results (from the past 240 hour(s))  MRSA PCR SCREENING     Status: Abnormal    Collection Time   10/22/10  3:30 AM      Component Value Range Status Comment   MRSA by PCR POSITIVE (*) NEGATIVE  Final   CULTURE, BLOOD (ROUTINE X 2)     Status: Normal (Preliminary result)   Collection Time   10/22/10  5:16 AM      Component Value Range Status Comment   Specimen Description BLOOD LEFT ARM   Final    Special Requests BOTTLES DRAWN AEROBIC AND ANAEROBIC 6CC   Final    Culture NO GROWTH 2 DAYS   Final    Report Status PENDING   Incomplete   CULTURE, BLOOD (ROUTINE X 2)     Status: Normal (Preliminary result)   Collection Time   10/22/10  5:16 AM      Component Value Range Status Comment   Specimen Description BLOOD LEFT HAND   Final    Special Requests BOTTLES DRAWN AEROBIC AND ANAEROBIC 6CC   Final    Culture NO GROWTH 2 DAYS   Final    Report Status PENDING   Incomplete     Ct Chest W Contrast  10/23/2010  *RADIOLOGY REPORT*  Clinical Data: Follow up right lung nodule, history pneumonia, dementia, hypertension, COPD, GERD, atrial fibrillation, COPD  CT CHEST WITH CONTRAST  Technique:  Multidetector CT imaging of the chest was performed following the standard protocol during bolus administration of intravenous contrast. Sagittal and coronal MPR images reconstructed from axial data set.  Contrast: 80 ml Omnipaque 300 IV.  Comparison: 07/04/2010 Correlation:  Chest radiograph 10/21/2010  Findings: Bilateral breast prostheses showing capsular calcification  and intracapsular rupture. Extensive atherosclerotic calcifications aorta, less in coronary arteries. Visualized portion of upper abdomen unremarkable. 2.0 x 1.1 cm diameter subcarinal lymph node increase in size since previous exam. Additional mediastinal nodes appear unchanged. The tiny left pleural effusion. Left lower lobe consolidation. Atelectasis medial right lung base. Scattered peribronchial thickening.  Left upper lobe nodule, 9 x 8 mm image 27, previously 4 mm diameter. Spiculated right upper lobe density 11 x 10 x 9  mm in size, smaller than on previous exam though the appearance is still worrisome. Emphysematous changes in upper lobes. No additional pulmonary mass or nodule. No pneumothorax. Multilevel degenerative disc disease changes thoracic spine.  IMPRESSION: Previously identified spiculated mass in right upper lobe has decreased in size since previous study, though the characteristics of the lesion remain worrisome. Recommend attention on follow-up exams. Significant interval increase in size of a left upper lobe pulmonary nodule from 4 mm to 9 x 8 mm in size, cannot exclude metastatic disease. Left lower lobe consolidation and tiny pleural effusion. New enlarged subcarinal lymph node.  Original Report Authenticated By: Lollie Marrow, M.D.      Medications: I have reviewed the patient's current medications.  Impression: 1. Left lower lobe pneumonia. She is currently receiving vancomycin, cefepime, and Levaquin. Her white blood cell count has improved.. She is currently afebrile. 2. Right long spiculated mass, very likely a malignancy and associated left-sided pulmonary nodule, which has increased in size, likely a metastatic lesion. The patient acknowledges that she probably does have cancer. She is a DO NOT RESUSCITATE which she acknowledges as well. She is considering hospice and/or hospice home. 3. Left-sided parotiditis/cellulitis. There is less erythema, but still quite a bit of induration and swelling and associated pain. She is being treated with the antibiotics above. 4. COPD with evidence of emphysema on CT scan of the chest. She is on Pulmicort nebulization and Atrovent nebulization. 5. Chronic atrial fibrillation, chronically anticoagulated. Her heart rate is well controlled. Her INR is therapeutic. 6. Hypertension, stable. 7. Hypokalemia, continues to be low. 8. Mild hyponatremia. 9. Chronic constipation, MiraLAX added. 10. Anemia, likely secondary to chronic disease. 11. Deconditioning,  physical therapist note acknowledged.  Plan:  1. Replete potassium chloride, increased the dose to 30 mEq twice a day. Monitor serum potassium daily. Will check a blood magnesium level to rule out deficiency. Will narrow the antibiotic therapy to vancomycin and oral Levaquin. We'll discontinue cefepime instead. Discontinue Foley catheter this morning. Continue to discuss or consider hospice and potentially hospice home of  [facility], when the patient is ready. Likely discharge in the next 48 hours.   LOS: 4 days   FISHER,DENISE 10/25/2010, 9:42 AM

## 2010-10-25 NOTE — Consult Note (Signed)
CSW reviewed chart and updated PNC on pt. Possible d/c over weekend per MD. Facility can accept pt Saturday before 1:00 or on Monday if ready.  Pt considering hospice consult.  CSW to continue to follow.   Karn Cassis

## 2010-10-25 NOTE — Consult Note (Signed)
ANTICOAGULATION/ANTIBIOTICS CONSULT NOTE -    Pharmacy Consult for Warfarin, Vancomycin, Maxipime, Levaquin Indication: atrial fibrillation, suspected pna  Patient Measurements: Height: 5\' 4"  (162.6 cm) Weight: 166 lb 0.1 oz (75.3 kg) IBW/kg (Calculated) : 54.7   Vital Signs: Temp: 98.3 F (36.8 C) (08/31 0230) Temp src: Oral (08/31 0230) BP: 136/66 mmHg (08/31 0230) Pulse Rate: 90  (08/31 0230)  Labs: Results for Sanford, Madison (MRN [id number]) as of 10/24/2010 09:11  Ref. Range 10/23/2010 17:56  Vancomycin Tr Latest Range: 10.0-20.0 ug/mL 10.1    Basename 10/25/10 0532 10/24/10 0441 10/23/10 0406  HGB 9.8* 9.9* --  HCT 32.2* 32.6* 31.5*  PLT 239 221 220  APTT -- -- --  LABPROT 27.1* 27.0* 33.4*  INR 2.46* 2.45* 3.22*  HEPARINUNFRC -- -- --  CREATININE <0.47* <0.47* 0.47*  CRCLEARANCE -- -- --  CKTOTAL -- -- --  CKMB -- -- --  TROPONINI -- -- --   Medical History: Past Medical History  Diagnosis Date  . Hypertension   . Dementia   . COPD (chronic obstructive pulmonary disease)   . Depression   . Chronic respiratory failure with hypoxia   . Lung mass   . Anemia 10/24/2010   Assessment: Renal fxn OK (SCr OK) INR within target today. Vancomycin target low for PNA.  Goal of Therapy:  Eradicate infection. INR 2-3   Plan:  Coumadin 7.5mg  today Vancomycin 1250 mg iv every 12 hours. Maxipime 1gm iv q12hrs levaquin 750mg  iv q24hrs Monitor renal fxn, CBC  Hall, Scott A 10/25/2010,8:09 AM

## 2010-10-25 NOTE — Progress Notes (Signed)
10/25/10 1255 Patient refused po medications this morning.  Attempted to set up lunch tray and assist patient with lunch several times this afternoon. Pt stated "leave me alone, you are irritating me. Please leave and let me rest.". Encouraged patient to call when ready to eat and would get new tray. Nursing staff to continue to monitor and provide support as needed. Notified Dr Sherrie Mustache of unable to administer medications and not wanting to eat. Stated she would see patient again later today.  Will allow patient to rest and assist with meals when ready.

## 2010-10-25 NOTE — Consult Note (Signed)
This patient is receiving Levaquin by the intravenous route.  Based on criteria approved by the Pharmacy and Therapeutics Committee, the antibiotic(s) is/are being converted to the equivalent oral dose form(s).  These criteria include:  Patient being treated for a respiratory tract infection, urinary tract infection, or cellulitis  The patient is not neutropenic and does not exhibit a GI malabsorption state  The patient is eating (either orally or via tube) and/or has been taking other orally administered medications for a least 24 hours  The patient is improving clinically and has a Tmax < 100.5  If you have questions about this conversion, please contact the Pharmacy Department (ext [phone]).  Thank you.

## 2010-10-25 NOTE — Progress Notes (Signed)
10/25/10 1700 Patient voided independently earlier this afternoon after foley d/c'd this morning per orders. Pt also drank all of her tea from lunch this afternoon about 1400, but stated "i don't want anything to eat". Patient set up for supper this evening and agreed to try grilled cheese and tomato soup with her iced tea. Encouraged to try and eat something. Will monitor.

## 2010-10-25 NOTE — Progress Notes (Signed)
10/25/10 1755 Patient ate all of grilled cheese sandwich and half bowl of tomato soup, two cups of ice tea this evening for supper. Tolerated well. Nursing staff to continue to encourage po intake as tolerated and monitor.

## 2010-10-26 ENCOUNTER — Inpatient Hospital Stay
Admission: RE | Admit: 2010-10-26 | Discharge: 2011-02-08 | Payer: No Typology Code available for payment source | Source: Ambulatory Visit | Attending: Internal Medicine | Admitting: Internal Medicine

## 2010-10-26 DIAGNOSIS — C3490 Malignant neoplasm of unspecified part of unspecified bronchus or lung: Secondary | ICD-10-CM

## 2010-10-26 LAB — BASIC METABOLIC PANEL
CO2: 30 mEq/L (ref 19–32)
Calcium: 8.4 mg/dL (ref 8.4–10.5)
Chloride: 103 mEq/L (ref 96–112)
Glucose, Bld: 88 mg/dL (ref 70–99)
Sodium: 139 mEq/L (ref 135–145)

## 2010-10-26 MED ORDER — LEVOFLOXACIN 750 MG PO TABS: 750.0000 mg | ORAL_TABLET | Freq: Every day | ORAL | Status: AC

## 2010-10-26 MED ORDER — WARFARIN SODIUM 7.5 MG PO TABS: ORAL_TABLET | ORAL | Status: DC

## 2010-10-26 NOTE — Discharge Summary (Signed)
Physician Discharge Summary  Patient ID: Madison Sanford MRN: [id number] DOB/AGE: [birth year] 77 y.o. Primary Care Physician:ROBSON,MICHAEL Madison Shropshire, MD Admit date: 10/21/2010 Discharge date: 10/26/2010    Discharge Diagnoses:  1. Left parotiditis, improving. 2. Left-sided pneumonia. 3. Presumed right upper lobe lung cancer with possible metastatic left upper lobe nodule. Patient does not wish any investigation or treatment thereof. 4. COPD. 5. Moderate dementia. 6. Hypertension. 7. Atrial fibrillation on chronic anticoagulation. 8. Depression and anxiety. 9. Gastroesophageal reflux disease. 10. Microcytic anemia, no further investigation at this point. 11. Poor functional status and overall poor prognosis, likely hospice eligible. Patient has been given this option and she will think about it.   Current Discharge Medication List    START taking these medications   Details  levofloxacin (LEVAQUIN) 750 MG tablet Take 1 tablet (750 mg total) by mouth at bedtime. Qty: 7 tablet, Refills: 0      CONTINUE these medications which have CHANGED   Details  warfarin (COUMADIN) 7.5 MG tablet Stay on warfarin 7.5 mg daily as long as antibiotics are given. Thereafter warfarin to be adjusted based on prothrombin time/INR. Qty: 30 tablet, Refills: 0      CONTINUE these medications which have NOT CHANGED   Details  ALPRAZolam (XANAX) 0.5 MG tablet Take 0.5 mg by mouth 2 (two) times daily.      budesonide (PULMICORT) 0.25 MG/2ML nebulizer solution Take 0.25 mg by nebulization 2 (two) times daily.      buPROPion (WELLBUTRIN XL) 150 MG 24 hr tablet Take 150 mg by mouth daily.      cycloSPORINE (RESTASIS) 0.05 % ophthalmic emulsion Place 1 drop into both eyes 2 (two) times daily.      digoxin (LANOXIN) 0.25 MG tablet Take 250 mcg by mouth daily.      diltiazem (CARDIZEM CD) 240 MG 24 hr capsule Take 240 mg by mouth daily.      doxepin (SINEQUAN) 10 MG capsule Take 20 mg by mouth at bedtime.       esomeprazole (NEXIUM) 40 MG capsule Take 40 mg by mouth daily. On an empty stomach     furosemide (LASIX) 20 MG tablet Take 60 mg by mouth daily.      guaiFENesin (MUCINEX) 600 MG 12 hr tablet Take 1,200 mg by mouth 2 (two) times daily.      ipratropium (ATROVENT) 0.02 % nebulizer solution Take 500 mcg by nebulization every 6 (six) hours.      levalbuterol (XOPENEX) 1.25 MG/3ML nebulizer solution Take 1 ampule by nebulization every 6 (six) hours as needed.      lidocaine (LIDODERM) 5 % Place 1 patch onto the skin daily. Apply to shoulder. Remove & Discard patch within 12 hours or as directed by MD     metoprolol tartrate (LOPRESSOR) 25 MG tablet Take 25 mg by mouth 2 (two) times daily.      oxyCODONE (OXYCONTIN) 20 MG 12 hr tablet Take 20 mg by mouth every 12 (twelve) hours.      polyethylene glycol powder (GLYCOLAX/MIRALAX) powder Take 17 g by mouth daily.      potassium chloride (KLOR-CON) 10 MEQ CR tablet Take 30 mEq by mouth every morning.      potassium chloride SA (K-DUR,KLOR-CON) 20 MEQ tablet Take 20 mEq by mouth every evening.      primidone (MYSOLINE) 50 MG tablet Take 25 mg by mouth 3 (three) times daily. One-half tablet three times daily     senna-docusate (SENOKOT S) 8.6-50 MG per tablet Take  1 tablet by mouth every morning. **Hold for Diarrhea**     !! sertraline (ZOLOFT) 100 MG tablet Take 100 mg by mouth daily. Along with Zoloft 50mg  to equal a total dose of 150mg  daily     !! sertraline (ZOLOFT) 50 MG tablet Take 50 mg by mouth daily. Take with Zoloft 100mg  to equal a total daily dose of 150mg  daily     tiotropium (SPIRIVA) 18 MCG inhalation capsule Place 18 mcg into inhaler and inhale daily.      traZODone (DESYREL) 50 MG tablet Take 50 mg by mouth at bedtime.       !! - Potential duplicate medications found. Please discuss with provider.    STOP taking these medications     predniSONE (DELTASONE) 10 MG tablet         Discharged Condition: Stable and  improved although overall prognosis poor.    Consults: None.  Significant Diagnostic Studies: Dg Chest 2 View  10/21/2010  *RADIOLOGY REPORT*  Clinical Data: Shortness of breath, cough  CHEST - 2 VIEW  Comparison: 07/03/2010 Correlation:  CT chest 07/04/2010  Findings: Enlargement of cardiac silhouette. Calcified tortuous aorta. Pulmonary vascularity normal. Bilateral breast prostheses. Left basilar infiltrate new since prior exam. Underlying emphysematous and bronchitic changes. Remaining lungs grossly clear. Nodular density in the right upper lobe is unchanged, appearance on prior CT worrisome for right upper lobe malignancy. Bones diffusely deep mineralized with scattered end plate spur formation and disc space narrowing.  IMPRESSION: Emphysematous and bronchitic changes with new left basilar infiltrate compatible with pneumonia. Right upper lobe nodular density worrisome for malignancy based on prior CT appearance, please reference that report of the prior CT exam.  Original Report Authenticated By: Lollie Marrow, M.D.   Ct Soft Tissue Neck W Contrast  10/22/2010  *RADIOLOGY REPORT*  Clinical Data:  Left-sided facial and neck swelling, fever  CT NECK WITH CONTRAST, CT MAXILLOFACIAL WITH CONTRAST  Technique:  Multidetector CT imaging of the neck was performed using the standard protocol following the bolus administration of intravenous contrast.  Multidetector CT imaging of the maxillofacial structures was performed.  Multiplanar CT image reconstructions were also generated.  A small metallic BB was placed on the right temple in order to reliably differentiate right from left.  Contrast: 100 ml Omnipaque 300 IV contrast  CT NECK WITH CONTRAST  Comparison:  Chest CT 07/04/2010  Findings: Findings pertaining to the parotid gland will be discussed in detail below.  Emphysematous changes are noted at the lung apices.  Airway is midline and patent.  Thyroid gland is unremarkable.  Left carotid bulb  calcification incidentally noted. A few small presumably reactive submandibular nodes are noted, left greater than right.  Visualized paranasal sinuses are unremarkable. Incomplete left mastoid air cell opacification noted.  IMPRESSION: No acute abnormality in the neck.  Please see comments in dedicated report below regarding left parotid gland.  CT MAXILLOFACIAL WITH CONTRAST  Findings:  There is diffuse stranding / infiltration and enlargement of the left parotid gland, with overlying skin thickening and subcutaneous stranding.  No focal definable rim enhancing fluid collection is seen to suggest abscess formation. There is incomplete opacification of the left mastoid air cells. No osseous destruction or sclerosis is noted. Examination is degraded by patient motion.  Allowing for this, no fracture is identified.  Submandibular glands are normal.  Several small adjacent lymph nodes are noted.  Allowing for the presence of streak artifact from dental amalgam, no definite calcification is seen along the  expected course of the left parotid duct to suggest calcification.  IMPRESSION: Swelling and internal stranding of the left parotid gland with overlying soft tissue stranding and skin thickening, likely indicating primary or secondary parotitis with possible overlying cellulitis/local spread of inflammatory reaction.  Malignant infiltration in the setting of the previously seen right upper lobe spiculated mass would be much less likely.  Lymphomatous involvement or other neoplastic involvement would be also similarly less likely.  Original Report Authenticated By: Harrel Lemon, M.D.   Ct Chest W Contrast  10/23/2010  *RADIOLOGY REPORT*  Clinical Data: Follow up right lung nodule, history pneumonia, dementia, hypertension, COPD, GERD, atrial fibrillation, COPD  CT CHEST WITH CONTRAST  Technique:  Multidetector CT imaging of the chest was performed following the standard protocol during bolus administration of  intravenous contrast. Sagittal and coronal MPR images reconstructed from axial data set.  Contrast: 80 ml Omnipaque 300 IV.  Comparison: 07/04/2010 Correlation:  Chest radiograph 10/21/2010  Findings: Bilateral breast prostheses showing capsular calcification and intracapsular rupture. Extensive atherosclerotic calcifications aorta, less in coronary arteries. Visualized portion of upper abdomen unremarkable. 2.0 x 1.1 cm diameter subcarinal lymph node increase in size since previous exam. Additional mediastinal nodes appear unchanged. The tiny left pleural effusion. Left lower lobe consolidation. Atelectasis medial right lung base. Scattered peribronchial thickening.  Left upper lobe nodule, 9 x 8 mm image 27, previously 4 mm diameter. Spiculated right upper lobe density 11 x 10 x 9 mm in size, smaller than on previous exam though the appearance is still worrisome. Emphysematous changes in upper lobes. No additional pulmonary mass or nodule. No pneumothorax. Multilevel degenerative disc disease changes thoracic spine.  IMPRESSION: Previously identified spiculated mass in right upper lobe has decreased in size since previous study, though the characteristics of the lesion remain worrisome. Recommend attention on follow-up exams. Significant interval increase in size of a left upper lobe pulmonary nodule from 4 mm to 9 x 8 mm in size, cannot exclude metastatic disease. Left lower lobe consolidation and tiny pleural effusion. New enlarged subcarinal lymph node.  Original Report Authenticated By: Lollie Marrow, M.D.   US Abdomen Complete  10/22/2010  *RADIOLOGY REPORT*  Clinical Data:  Hepatomegaly, history of lung cancer  ULTRASOUND ABDOMEN:  Technique:  Sonography of upper abdominal structures was performed.  Comparison:  05/17/2009  Gallbladder:  Normally distended without stones or sonographic Murphy's sign.  Gallbladder wall appears upper normal in thickness. No definite pericholecystic fluid.  Common bile duct:   7 mm diameter, mildly prominent size, potentially related to age, not significantly changed from previous study.  Liver:  Echogenic, likely fatty infiltration, though this can be seen with cirrhosis and certain infiltrative disorders.  Hepatic parenchyma is slightly inhomogeneous without discrete mass or nodule.  Liver appears minimally prominent in size, similar to prior exams.  Hepatopetal portal venous flow.  IVC:  Normal appearance  Pancreas:  Normal appearance  Spleen:  Upper normal size without focal mass, 12.1 cm length  Right kidney:  12.8 cm length. Normal morphology without mass or hydronephrosis.  Left kidney:  12.0 cm length. Normal morphology without mass or hydronephrosis.  Aorta:  Normal caliber  Other:  No free fluid  IMPRESSION: Probable fatty infiltration of the liver as above, with liver appearing minimally prominent size. No focal hepatic mass or nodularity. Upper normal thickness of gallbladder wall without evidence of gallstones, pericholecystic fluid or sonographic Murphy's sign. Remainder of exam unremarkable.  Original Report Authenticated By: Lollie Marrow, M.D.  Ct Maxillofacial W/cm  10/22/2010  *RADIOLOGY REPORT*  Clinical Data:  Left-sided facial and neck swelling, fever  CT NECK WITH CONTRAST, CT MAXILLOFACIAL WITH CONTRAST  Technique:  Multidetector CT imaging of the neck was performed using the standard protocol following the bolus administration of intravenous contrast.  Multidetector CT imaging of the maxillofacial structures was performed.  Multiplanar CT image reconstructions were also generated.  A small metallic BB was placed on the right temple in order to reliably differentiate right from left.  Contrast: 100 ml Omnipaque 300 IV contrast  CT NECK WITH CONTRAST  Comparison:  Chest CT 07/04/2010  Findings: Findings pertaining to the parotid gland will be discussed in detail below.  Emphysematous changes are noted at the lung apices.  Airway is midline and patent.  Thyroid  gland is unremarkable.  Left carotid bulb calcification incidentally noted. A few small presumably reactive submandibular nodes are noted, left greater than right.  Visualized paranasal sinuses are unremarkable. Incomplete left mastoid air cell opacification noted.  IMPRESSION: No acute abnormality in the neck.  Please see comments in dedicated report below regarding left parotid gland.  CT MAXILLOFACIAL WITH CONTRAST  Findings:  There is diffuse stranding / infiltration and enlargement of the left parotid gland, with overlying skin thickening and subcutaneous stranding.  No focal definable rim enhancing fluid collection is seen to suggest abscess formation. There is incomplete opacification of the left mastoid air cells. No osseous destruction or sclerosis is noted. Examination is degraded by patient motion.  Allowing for this, no fracture is identified.  Submandibular glands are normal.  Several small adjacent lymph nodes are noted.  Allowing for the presence of streak artifact from dental amalgam, no definite calcification is seen along the expected course of the left parotid duct to suggest calcification.  IMPRESSION: Swelling and internal stranding of the left parotid gland with overlying soft tissue stranding and skin thickening, likely indicating primary or secondary parotitis with possible overlying cellulitis/local spread of inflammatory reaction.  Malignant infiltration in the setting of the previously seen right upper lobe spiculated mass would be much less likely.  Lymphomatous involvement or other neoplastic involvement would be also similarly less likely.  Original Report Authenticated By: Harrel Lemon, M.D.    Lab Results: Results for orders placed during the hospital encounter of 10/21/10 (from the past 48 hour(s))  PROTIME-INR     Status: Abnormal   Collection Time   10/25/10  5:32 AM      Component Value Range Comment   Prothrombin Time 27.1 (*) 11.6 - 15.2 (seconds)    INR 2.46 (*)  0.00 - 1.49    CBC     Status: Abnormal   Collection Time   10/25/10  5:32 AM      Component Value Range Comment   WBC 9.5  4.0 - 10.5 (K/uL)    RBC 4.53  3.87 - 5.11 (MIL/uL)    Hemoglobin 9.8 (*) 12.0 - 15.0 (g/dL)    HCT 11.9 (*) 14.7 - 46.0 (%)    MCV 71.1 (*) 78.0 - 100.0 (fL)    MCH 21.6 (*) 26.0 - 34.0 (pg)    MCHC 30.4  30.0 - 36.0 (g/dL)    RDW 82.9 (*) 56.2 - 15.5 (%)    Platelets 239  150 - 400 (K/uL)   BASIC METABOLIC PANEL     Status: Abnormal   Collection Time   10/25/10  5:32 AM      Component Value Range Comment   Sodium  133 (*) 135 - 145 (mEq/L)    Potassium 3.0 (*) 3.5 - 5.1 (mEq/L)    Chloride 97  96 - 112 (mEq/L)    CO2 29  19 - 32 (mEq/L)    Glucose, Bld 86  70 - 99 (mg/dL)    BUN 5 (*) 6 - 23 (mg/dL)    Creatinine, Ser <1.61 (*) 0.50 - 1.10 (mg/dL)    Calcium 8.3 (*) 8.4 - 10.5 (mg/dL)    GFR calc non Af Amer NOT CALCULATED  >60 (mL/min)    GFR calc Af Amer NOT CALCULATED  >60 (mL/min)   MAGNESIUM     Status: Normal   Collection Time   10/25/10  5:32 AM      Component Value Range Comment   Magnesium 1.9  1.5 - 2.5 (mg/dL)   PROTIME-INR     Status: Abnormal   Collection Time   10/26/10  4:58 AM      Component Value Range Comment   Prothrombin Time 29.8 (*) 11.6 - 15.2 (seconds)    INR 2.78 (*) 0.00 - 1.49    BASIC METABOLIC PANEL     Status: Abnormal   Collection Time   10/26/10  4:58 AM      Component Value Range Comment   Sodium 139  135 - 145 (mEq/L)    Potassium 3.4 (*) 3.5 - 5.1 (mEq/L)    Chloride 103  96 - 112 (mEq/L)    CO2 30  19 - 32 (mEq/L)    Glucose, Bld 88  70 - 99 (mg/dL)    BUN 4 (*) 6 - 23 (mg/dL)    Creatinine, Ser <0.96 (*) 0.50 - 1.10 (mg/dL)    Calcium 8.4  8.4 - 10.5 (mg/dL)    GFR calc non Af Amer NOT CALCULATED  >60 (mL/min)    GFR calc Af Amer NOT CALCULATED  >60 (mL/min)   MAGNESIUM     Status: Normal   Collection Time   10/26/10  4:58 AM      Component Value Range Comment   Magnesium 2.0  1.5 - 2.5 (mg/dL)    Recent  Results (from the past 240 hour(s))  MRSA PCR SCREENING     Status: Abnormal   Collection Time   10/22/10  3:30 AM      Component Value Range Status Comment   MRSA by PCR POSITIVE (*) NEGATIVE  Final   CULTURE, BLOOD (ROUTINE X 2)     Status: Normal (Preliminary result)   Collection Time   10/22/10  5:16 AM      Component Value Range Status Comment   Specimen Description BLOOD LEFT ARM   Final    Special Requests BOTTLES DRAWN AEROBIC AND ANAEROBIC 6CC   Final    Culture NO GROWTH 2 DAYS   Final    Report Status PENDING   Incomplete   CULTURE, BLOOD (ROUTINE X 2)     Status: Normal (Preliminary result)   Collection Time   10/22/10  5:16 AM      Component Value Range Status Comment   Specimen Description BLOOD LEFT HAND   Final    Special Requests BOTTLES DRAWN AEROBIC AND ANAEROBIC 6CC   Final    Culture NO GROWTH 2 DAYS   Final    Report Status PENDING   Incomplete      Hospital Course: This 77 year old lady was admitted with lethargy and swelling of the left side of the face. She also had subjective fever. Please see  initial history and physical examination done by Dr. Vania Rea. She had an abnormal chest x-ray with evidence of left sided pneumonia and leukocytosis. She also had parotiditis clinically and radiologically. She was therefore started on intravenous antibiotics. All blood cultures have been negative. She also had a repeat CT scan of her chest which was indicative of a possible metastatic left-sided lesion  in addition to the spiculated mass seen earlier on the right side. This has been discussed with her and her 2 sons. Although her overall functional status is rather poor, she has improved from her left parotiditis and is feeling better. She has been transitioned into intravenous vancomycin and oral Levaquin now and I think that oral Levaquin for another week should suffice. Her anticoagulation has been monitored during hospitalization and she is currently on warfarin 7.5  mg daily. This is a lower dose than previously and this will probably need to be adjusted upwards once her antibiotics are discontinued. She does not complain of cough, dyspnea and does not have a fever currently. Her left side of her face is slightly still sore  but significantly improved and she is does seem to have a reasonable appetite at the present time.  Discharge Exam: Blood pressure 131/64, pulse 67, temperature 98 F (36.7 C), temperature source Oral, resp. rate 20, height 5\' 4"  (1.626 m), weight 75.3 kg (166 lb 0.1 oz), SpO2 97.00%. She looks chronically sick. The left parotiditis is significantly improved with minimal induration and tenderness here. Lung fields posteriorly are clear with a few scattered crackles. There is no increased work of breathing. There is no peripheral or central cyanosis. Heart sounds are present and appear to be in atrial fibrillation. She is alert and appears to be more orientated than I had seen her previously. There are no focal neurological signs.  Disposition: Skilled nursing facility, the [facility]. She will consider hospice services in the future.  Discharge Orders    Future Orders Please Complete By Expires   Diet - low sodium heart healthy      Increase activity slowly      Discharge instructions      Comments:   He should have your chest x-ray repeated in approximately 4-6 weeks' time.        SignedWilson Singer 10/26/2010, 7:54 AM

## 2010-10-26 NOTE — Progress Notes (Signed)
10/26/10 1025 Patient being discharged to [facility] today. Called report to Olegario Messier, [employer] receiving nurse at [facility]. Pt in stable condition to be transported to [facility] accompanied by nurse techs.

## 2010-10-27 LAB — CULTURE, BLOOD (ROUTINE X 2)
Culture: NO GROWTH
Culture: NO GROWTH

## 2010-10-30 LAB — GLUCOSE, CAPILLARY: Glucose-Capillary: 93 mg/dL (ref 70–99)

## 2010-10-31 LAB — GLUCOSE, CAPILLARY: Glucose-Capillary: 95 mg/dL (ref 70–99)

## 2010-11-03 LAB — GLUCOSE, CAPILLARY: Glucose-Capillary: 91 mg/dL (ref 70–99)

## 2010-11-05 LAB — GLUCOSE, CAPILLARY

## 2010-11-05 NOTE — Progress Notes (Signed)
Encounter addended by: Karen Kays on: 11/05/2010 11:42 AM<BR>     Documentation filed: Flowsheet VN

## 2010-11-06 LAB — GLUCOSE, CAPILLARY: Glucose-Capillary: 107 mg/dL — ABNORMAL HIGH (ref 70–99)

## 2010-11-07 LAB — GLUCOSE, CAPILLARY: Glucose-Capillary: 123 mg/dL — ABNORMAL HIGH (ref 70–99)

## 2010-11-08 LAB — GLUCOSE, CAPILLARY: Glucose-Capillary: 96 mg/dL (ref 70–99)

## 2010-11-09 LAB — GLUCOSE, CAPILLARY: Glucose-Capillary: 99 mg/dL (ref 70–99)

## 2010-11-11 LAB — GLUCOSE, CAPILLARY: Glucose-Capillary: 125 mg/dL — ABNORMAL HIGH (ref 70–99)

## 2010-11-13 ENCOUNTER — Ambulatory Visit (HOSPITAL_COMMUNITY): Payer: No Typology Code available for payment source | Attending: Internal Medicine

## 2010-11-13 DIAGNOSIS — Z9889 Other specified postprocedural states: Secondary | ICD-10-CM | POA: Insufficient documentation

## 2010-11-13 DIAGNOSIS — I998 Other disorder of circulatory system: Secondary | ICD-10-CM | POA: Insufficient documentation

## 2010-11-13 DIAGNOSIS — K5900 Constipation, unspecified: Secondary | ICD-10-CM | POA: Insufficient documentation

## 2010-11-13 LAB — GLUCOSE, CAPILLARY: Glucose-Capillary: 193 mg/dL — ABNORMAL HIGH (ref 70–99)

## 2010-11-14 LAB — GLUCOSE, CAPILLARY: Glucose-Capillary: 122 mg/dL — ABNORMAL HIGH (ref 70–99)

## 2010-11-15 LAB — GLUCOSE, CAPILLARY: Glucose-Capillary: 97 mg/dL (ref 70–99)

## 2010-11-17 LAB — GLUCOSE, CAPILLARY: Glucose-Capillary: 119 mg/dL — ABNORMAL HIGH (ref 70–99)

## 2010-11-18 LAB — GLUCOSE, CAPILLARY: Glucose-Capillary: 99 mg/dL (ref 70–99)

## 2010-11-19 LAB — GLUCOSE, CAPILLARY: Glucose-Capillary: 131 mg/dL — ABNORMAL HIGH (ref 70–99)

## 2010-11-21 LAB — BASIC METABOLIC PANEL
CO2: 27
CO2: 32
Calcium: 8.6
Chloride: 98
Chloride: 98
GFR calc Af Amer: >60
Glucose, Bld: 105 — ABNORMAL HIGH
Potassium: 4
Sodium: 131 — ABNORMAL LOW
Sodium: 137

## 2010-11-21 LAB — HEMOGLOBIN AND HEMATOCRIT, BLOOD: HCT: 37.6

## 2010-11-22 LAB — GLUCOSE, CAPILLARY: Glucose-Capillary: 104 mg/dL — ABNORMAL HIGH (ref 70–99)

## 2010-11-23 LAB — GLUCOSE, CAPILLARY: Glucose-Capillary: 106 mg/dL — ABNORMAL HIGH (ref 70–99)

## 2010-11-24 LAB — GLUCOSE, CAPILLARY: Glucose-Capillary: 89 mg/dL (ref 70–99)

## 2010-11-27 LAB — GLUCOSE, CAPILLARY: Glucose-Capillary: 135 mg/dL — ABNORMAL HIGH (ref 70–99)

## 2010-11-28 LAB — GLUCOSE, CAPILLARY

## 2010-11-29 LAB — GLUCOSE, CAPILLARY: Glucose-Capillary: 105 mg/dL — ABNORMAL HIGH (ref 70–99)

## 2010-11-30 LAB — GLUCOSE, CAPILLARY: Glucose-Capillary: 162 mg/dL — ABNORMAL HIGH (ref 70–99)

## 2010-12-03 LAB — GLUCOSE, CAPILLARY: Glucose-Capillary: 117 mg/dL — ABNORMAL HIGH (ref 70–99)

## 2010-12-09 LAB — GLUCOSE, CAPILLARY: Glucose-Capillary: 125 mg/dL — ABNORMAL HIGH (ref 70–99)

## 2010-12-13 LAB — GLUCOSE, CAPILLARY: Glucose-Capillary: 107 mg/dL — ABNORMAL HIGH (ref 70–99)

## 2010-12-16 LAB — GLUCOSE, CAPILLARY: Glucose-Capillary: 106 mg/dL — ABNORMAL HIGH (ref 70–99)

## 2010-12-23 LAB — GLUCOSE, CAPILLARY: Glucose-Capillary: 118 mg/dL — ABNORMAL HIGH (ref 70–99)

## 2010-12-27 LAB — GLUCOSE, CAPILLARY: Glucose-Capillary: 144 mg/dL — ABNORMAL HIGH (ref 70–99)

## 2010-12-30 LAB — GLUCOSE, CAPILLARY: Glucose-Capillary: 112 mg/dL — ABNORMAL HIGH (ref 70–99)

## 2011-01-01 LAB — GLUCOSE, CAPILLARY

## 2011-01-03 LAB — GLUCOSE, CAPILLARY: Glucose-Capillary: 91 mg/dL (ref 70–99)

## 2011-01-07 ENCOUNTER — Encounter (HOSPITAL_COMMUNITY)
Admit: 2011-01-07 | Discharge: 2011-01-07 | Disposition: A | Discharge status: Skilled Nursing Facility | Payer: No Typology Code available for payment source | Source: Skilled Nursing Facility | Attending: Internal Medicine | Admitting: Internal Medicine

## 2011-01-07 ENCOUNTER — Encounter (HOSPITAL_COMMUNITY)
Admit: 2011-01-07 | Discharge: 2011-01-07 | Disposition: A | Discharge status: Home / Self Care | Payer: No Typology Code available for payment source | Attending: Internal Medicine | Admitting: Internal Medicine

## 2011-01-07 DIAGNOSIS — IMO0001 Reserved for inherently not codable concepts without codable children: Secondary | ICD-10-CM | POA: Insufficient documentation

## 2011-01-07 DIAGNOSIS — C3490 Malignant neoplasm of unspecified part of unspecified bronchus or lung: Secondary | ICD-10-CM | POA: Insufficient documentation

## 2011-01-07 DIAGNOSIS — R937 Abnormal findings on diagnostic imaging of other parts of musculoskeletal system: Secondary | ICD-10-CM | POA: Insufficient documentation

## 2011-01-07 MED ORDER — TECHNETIUM TC 99M MEDRONATE IV KIT
25.0000 | PACK | Freq: Once | INTRAVENOUS | Status: AC | PRN
  Administered 2011-01-07: 23 via INTRAVENOUS

## 2011-01-10 LAB — GLUCOSE, CAPILLARY: Glucose-Capillary: 105 mg/dL — ABNORMAL HIGH (ref 70–99)

## 2011-01-13 LAB — GLUCOSE, CAPILLARY: Glucose-Capillary: 95 mg/dL (ref 70–99)

## 2011-01-15 LAB — GLUCOSE, CAPILLARY: Glucose-Capillary: 98 mg/dL (ref 70–99)

## 2011-01-20 LAB — GLUCOSE, CAPILLARY: Glucose-Capillary: 104 mg/dL — ABNORMAL HIGH (ref 70–99)

## 2011-01-24 LAB — GLUCOSE, CAPILLARY: Glucose-Capillary: 137 mg/dL — ABNORMAL HIGH (ref 70–99)

## 2011-01-27 LAB — GLUCOSE, CAPILLARY: Glucose-Capillary: 109 mg/dL — ABNORMAL HIGH (ref 70–99)

## 2011-01-29 LAB — GLUCOSE, CAPILLARY: Glucose-Capillary: 95 mg/dL (ref 70–99)

## 2011-02-03 LAB — GLUCOSE, CAPILLARY: Glucose-Capillary: 126 mg/dL — ABNORMAL HIGH (ref 70–99)

## 2011-02-07 LAB — GLUCOSE, CAPILLARY

## 2011-02-08 ENCOUNTER — Emergency Department (HOSPITAL_COMMUNITY)
Admission: EM | Admit: 2011-02-08 | Discharge: 2011-02-08 | Disposition: A | Discharge status: Skilled Nursing Facility | Payer: No Typology Code available for payment source | Attending: Emergency Medicine | Admitting: Emergency Medicine

## 2011-02-08 ENCOUNTER — Encounter (HOSPITAL_COMMUNITY): Payer: Self-pay | Admitting: *Deleted

## 2011-02-08 DIAGNOSIS — R040 Epistaxis: Secondary | ICD-10-CM | POA: Insufficient documentation

## 2011-02-08 DIAGNOSIS — I10 Essential (primary) hypertension: Secondary | ICD-10-CM | POA: Insufficient documentation

## 2011-02-08 DIAGNOSIS — Z87891 Personal history of nicotine dependence: Secondary | ICD-10-CM | POA: Insufficient documentation

## 2011-02-08 DIAGNOSIS — F3289 Other specified depressive episodes: Secondary | ICD-10-CM | POA: Insufficient documentation

## 2011-02-08 DIAGNOSIS — Z862 Personal history of diseases of the blood and blood-forming organs and certain disorders involving the immune mechanism: Secondary | ICD-10-CM | POA: Insufficient documentation

## 2011-02-08 DIAGNOSIS — Z7901 Long term (current) use of anticoagulants: Secondary | ICD-10-CM | POA: Insufficient documentation

## 2011-02-08 DIAGNOSIS — J4489 Other specified chronic obstructive pulmonary disease: Secondary | ICD-10-CM | POA: Insufficient documentation

## 2011-02-08 DIAGNOSIS — R0902 Hypoxemia: Secondary | ICD-10-CM | POA: Insufficient documentation

## 2011-02-08 DIAGNOSIS — F0390 Unspecified dementia without behavioral disturbance: Secondary | ICD-10-CM | POA: Insufficient documentation

## 2011-02-08 DIAGNOSIS — F329 Major depressive disorder, single episode, unspecified: Secondary | ICD-10-CM | POA: Insufficient documentation

## 2011-02-08 DIAGNOSIS — J9610 Chronic respiratory failure, unspecified whether with hypoxia or hypercapnia: Secondary | ICD-10-CM | POA: Insufficient documentation

## 2011-02-08 DIAGNOSIS — J449 Chronic obstructive pulmonary disease, unspecified: Secondary | ICD-10-CM | POA: Insufficient documentation

## 2011-02-08 LAB — DIFFERENTIAL
Basophils Absolute: 0.1 10*3/uL (ref 0.0–0.1)
Eosinophils Absolute: 0 10*3/uL (ref 0.0–0.7)
Eosinophils Relative: 0 % (ref 0–5)
Lymphocytes Relative: 33 % (ref 12–46)
Lymphs Abs: 2.8 10*3/uL (ref 0.7–4.0)
Neutrophils Relative %: 58 % (ref 43–77)

## 2011-02-08 LAB — CBC
MCH: 23.9 pg — ABNORMAL LOW (ref 26.0–34.0)
MCV: 76.3 fL — ABNORMAL LOW (ref 78.0–100.0)
Platelets: 193 10*3/uL (ref 150–400)
RBC: 4.76 MIL/uL (ref 3.87–5.11)
RDW: 18.2 % — ABNORMAL HIGH (ref 11.5–15.5)
WBC: 8.7 10*3/uL (ref 4.0–10.5)

## 2011-02-08 LAB — PROTIME-INR
INR: 2.04 — ABNORMAL HIGH (ref 0.00–1.49)
Prothrombin Time: 23.4 seconds — ABNORMAL HIGH (ref 11.6–15.2)

## 2011-02-08 NOTE — ED Provider Notes (Signed)
History     CSN: [id number] Arrival date & time: 02/08/2011  1:33 PM   First MD Initiated Contact with Patient 02/08/11 1506      Chief Complaint  Patient presents with  . Epistaxis    (Consider location/radiation/quality/duration/timing/severity/associated sxs/prior treatment) HPI Patient had blood from right nares after coughing after nap today.  Patient is at [facility].  Patient on coumadin.  Bleeding stopped after pressure for two hours.  [facility] blood but patient states that was after she was lying down and sat up.  Patient states after she sat up it was coming from right nares.  No pain.  Patient has had similar events in past.  She is on oxygen and states humidified oxygen now.  She states water had run out oxygen for a couple of days.   Past Medical History  Diagnosis Date  . Hypertension   . Dementia   . COPD (chronic obstructive pulmonary disease)   . Depression   . Chronic respiratory failure with hypoxia   . Lung mass   . Anemia 10/24/2010    History reviewed. No pertinent past surgical history.  No family history on file.  History  Substance Use Topics  . Smoking status: Former Smoker -- 1.5 packs/day for 40 years    Types: Cigarettes  . Smokeless tobacco: Not on file  . Alcohol Use: No    OB History    Grav Para Term Preterm Abortions TAB SAB Ect Mult Living                  Review of Systems  All other systems reviewed and are negative.    Allergies  Codeine  Home Medications   Current Outpatient Rx  Name Route Sig Dispense Refill  . ACETAMINOPHEN 325 MG PO TABS Oral Take 650 mg by mouth every 4 (four) hours as needed. For pain      . ALPRAZOLAM 0.5 MG PO TABS Oral Take 0.5 mg by mouth 2 (two) times daily.      . BUDESONIDE 0.25 MG/2ML IN SUSP Nebulization Take 0.25 mg by nebulization 2 (two) times daily.      . BUPROPION HCL ER (XL) 150 MG PO TB24 Oral Take 150 mg by mouth daily.     . CYCLOSPORINE 0.05 % OP EMUL  Both Eyes Place 1 drop into both eyes 2 (two) times daily.     Marland Kitchen DIGOXIN 0.25 MG PO TABS Oral Take 250 mcg by mouth daily. For Atrial fibrillation    . DILTIAZEM HCL ER COATED BEADS 240 MG PO CP24 Oral Take 240 mg by mouth daily. For Atrial fibrillation    . DOXEPIN HCL 10 MG PO CAPS Oral Take 10 mg by mouth at bedtime.     . FUROSEMIDE 20 MG PO TABS Oral Take 60 mg by mouth daily.     . GUAIFENESIN ER 600 MG PO TB12 Oral Take 1,200 mg by mouth 2 (two) times daily.      Marland Kitchen LEVALBUTEROL HCL 1.25 MG/3ML IN NEBU Nebulization Take 1 ampule by nebulization every 6 (six) hours.     Marland Kitchen LEVALBUTEROL HCL 1.25 MG/3ML IN NEBU Nebulization Take 1.25 mg by nebulization every 2 (two) hours as needed. For shortness of breath     . LIDOCAINE 5 % EX PTCH Transdermal Place 1 patch onto the skin daily. Apply to shoulder. Remove & Discard patch within 12 hours or as directed by MD    . LORAZEPAM 2  MG/ML PO CONC Oral Take 0.5 mg by mouth daily as needed. For Acute Anxiety. (or give lorazepam injection daily prn)     . LORAZEPAM 2 MG/ML IJ SOLN Intravenous Inject 0.5 mg into the vein daily as needed. For Acute anxiety     . METOPROLOL TARTRATE 25 MG PO TABS Oral Take 25 mg by mouth 2 (two) times daily.      Marland Kitchen ENLIVE PO Oral Take by mouth 2 (two) times daily.      Marland Kitchen OMEPRAZOLE 40 MG PO CPDR Oral Take 40 mg by mouth daily.      . OXYCODONE HCL 5 MG PO TABS Oral Take 5 mg by mouth every 4 (four) hours as needed. For pain     . OXYCODONE HCL ER 40 MG PO TB12 Oral Take 40 mg by mouth every 12 (twelve) hours.      Marland Kitchen POLYETHYLENE GLYCOL 3350 PO POWD Oral Take 17 g by mouth daily.      Marland Kitchen POTASSIUM CHLORIDE 10 MEQ PO TBCR Oral Take 20-30 mEq by mouth 2 (two) times daily. Take 30 mEq (3 capsules) every morning and 20 mEq (2 capsules) every evening.    Marland Kitchen PRIMIDONE 50 MG PO TABS Oral Take 25 mg by mouth 3 (three) times daily.     Bernadette Hoit SODIUM 8.6-50 MG PO TABS Oral Take 1 tablet by mouth 2 (two) times daily. **Hold  for Diarrhea**    . SERTRALINE HCL 100 MG PO TABS Oral Take 100 mg by mouth daily. Along with Zoloft 50mg  to equal a total dose of 150mg  daily     . SERTRALINE HCL 50 MG PO TABS Oral Take 50 mg by mouth daily. Take with Zoloft 100mg  to equal a total daily dose of 150mg  daily     . TIOTROPIUM BROMIDE MONOHYDRATE 18 MCG IN CAPS Inhalation Place 18 mcg into inhaler and inhale daily.     . WARFARIN SODIUM 1 MG PO TABS Oral Take 0.5 mg by mouth daily. Combine with 10mg  tablet to make total dose of 10.5mg  daily.     . WARFARIN SODIUM 10 MG PO TABS Oral Take 10 mg by mouth daily. Combine with 0.5mg  to make total dose of 10.5mg  daily       BP 140/48  Pulse 94  Temp(Src) 98.2 F (36.8 C) (Oral)  Resp 18  SpO2 98%  Physical Exam  Vitals reviewed. Constitutional: She is oriented to person, place, and time. She appears well-developed and well-nourished.  HENT:  Head: Normocephalic and atraumatic.       Right nares with eschar in place, no bleeding.  Eyes: Conjunctivae and EOM are normal. Pupils are equal, round, and reactive to light.  Neck: Normal range of motion. Neck supple.  Cardiovascular: An irregularly irregular rhythm present.  Pulmonary/Chest: Effort normal. She has decreased breath sounds.  Abdominal: Soft. Bowel sounds are normal.  Musculoskeletal: Normal range of motion.  Neurological: She is alert and oriented to person, place, and time.  Skin: Skin is warm and dry.    ED Course  Procedures (including critical care time)  Labs Reviewed - No data to display No results found.   No diagnosis found.    MDM   Results for orders placed during the hospital encounter of 02/08/11  CBC      Component Value Range   WBC 8.7  4.0 - 10.5 (K/uL)   RBC 4.76  3.87 - 5.11 (MIL/uL)   Hemoglobin 11.4 (*) 12.0 - 15.0 (g/dL)  HCT 36.3  36.0 - 46.0 (%)   MCV 76.3 (*) 78.0 - 100.0 (fL)   MCH 23.9 (*) 26.0 - 34.0 (pg)   MCHC 31.4  30.0 - 36.0 (g/dL)   RDW 16.1 (*) 09.6 - 15.5 (%)    Platelets 193  150 - 400 (K/uL)  DIFFERENTIAL      Component Value Range   Neutrophils Relative 58  43 - 77 (%)   Neutro Abs 5.1  1.7 - 7.7 (K/uL)   Lymphocytes Relative 33  12 - 46 (%)   Lymphs Abs 2.8  0.7 - 4.0 (K/uL)   Monocytes Relative 8  3 - 12 (%)   Monocytes Absolute 0.7  0.1 - 1.0 (K/uL)   Eosinophils Relative 0  0 - 5 (%)   Eosinophils Absolute 0.0  0.0 - 0.7 (K/uL)   Basophils Relative 1  0 - 1 (%)   Basophils Absolute 0.1  0.0 - 0.1 (K/uL)  PROTIME-INR      Component Value Range   Prothrombin Time 23.4 (*) 11.6 - 15.2 (seconds)   INR 2.04 (*) 0.00 - 1.49    Hemoglobin stable with last prior in August at 9. Patient with eschar in place and no bleeding here.  ADvise humidified oxygen, no manipulation, nosebleed precautions, and follow with ent if needed.       Hilario Quarry, MD 02/08/11 2154

## 2011-02-08 NOTE — ED Notes (Signed)
A&ox4; in no distress; left in c/o [facility] staff for return transport.  Instructions reviewed-verbalizes understanding.

## 2011-02-08 NOTE — ED Notes (Signed)
C/o nosebleed from right nare onset 1225 today; minimal bleeding noted at present.

## 2011-02-08 NOTE — ED Notes (Signed)
Pt awakened from nap today at 1225 with bleeding from right nare.  Sent from [facility] d/t unable to control nosebleed and pt on coumadin; per RN, last INR on 02/03/11 was 2.06

## 2011-02-08 NOTE — ED Notes (Signed)
No nose bleeding noted presently.

## 2011-02-19 ENCOUNTER — Ambulatory Visit (HOSPITAL_COMMUNITY)
Admission: RE | Admit: 2011-02-19 | Discharge: 2011-02-19 | Disposition: A | Discharge status: Home / Self Care | Payer: No Typology Code available for payment source | Source: Ambulatory Visit | Attending: Internal Medicine | Admitting: Internal Medicine

## 2011-02-19 ENCOUNTER — Other Ambulatory Visit (HOSPITAL_BASED_OUTPATIENT_CLINIC_OR_DEPARTMENT_OTHER): Payer: Self-pay | Admitting: Internal Medicine

## 2011-02-19 DIAGNOSIS — J449 Chronic obstructive pulmonary disease, unspecified: Secondary | ICD-10-CM

## 2011-02-19 DIAGNOSIS — R918 Other nonspecific abnormal finding of lung field: Secondary | ICD-10-CM | POA: Insufficient documentation

## 2011-02-19 DIAGNOSIS — J4489 Other specified chronic obstructive pulmonary disease: Secondary | ICD-10-CM | POA: Insufficient documentation

## 2011-02-19 DIAGNOSIS — R059 Cough, unspecified: Secondary | ICD-10-CM | POA: Insufficient documentation

## 2011-02-19 DIAGNOSIS — J984 Other disorders of lung: Secondary | ICD-10-CM | POA: Insufficient documentation

## 2011-02-19 DIAGNOSIS — R05 Cough: Secondary | ICD-10-CM | POA: Insufficient documentation

## 2011-02-20 ENCOUNTER — Inpatient Hospital Stay
Admission: AD | Admit: 2011-02-20 | Discharge: 2011-05-20 | Disposition: A | Discharge status: Other Acute Inpatient Hospital | Payer: No Typology Code available for payment source | Source: Ambulatory Visit | Attending: Internal Medicine | Admitting: Internal Medicine

## 2011-02-20 DIAGNOSIS — R05 Cough: Principal | ICD-10-CM

## 2011-03-05 LAB — GLUCOSE, CAPILLARY: Glucose-Capillary: 125 mg/dL — ABNORMAL HIGH (ref 70–99)

## 2011-03-07 LAB — GLUCOSE, CAPILLARY: Glucose-Capillary: 112 mg/dL — ABNORMAL HIGH (ref 70–99)

## 2011-03-10 LAB — GLUCOSE, CAPILLARY: Glucose-Capillary: 111 mg/dL — ABNORMAL HIGH (ref 70–99)

## 2011-03-12 LAB — GLUCOSE, CAPILLARY: Glucose-Capillary: 120 mg/dL — ABNORMAL HIGH (ref 70–99)

## 2011-03-21 ENCOUNTER — Ambulatory Visit (HOSPITAL_COMMUNITY)
Admit: 2011-03-21 | Discharge: 2011-03-21 | Disposition: A | Discharge status: Skilled Nursing Facility | Payer: No Typology Code available for payment source | Source: Skilled Nursing Facility | Attending: Internal Medicine | Admitting: Internal Medicine

## 2011-03-21 DIAGNOSIS — R05 Cough: Secondary | ICD-10-CM | POA: Insufficient documentation

## 2011-03-21 DIAGNOSIS — R059 Cough, unspecified: Secondary | ICD-10-CM | POA: Insufficient documentation

## 2011-03-21 DIAGNOSIS — R222 Localized swelling, mass and lump, trunk: Secondary | ICD-10-CM | POA: Insufficient documentation

## 2011-03-21 LAB — GLUCOSE, CAPILLARY

## 2011-03-24 LAB — GLUCOSE, CAPILLARY: Glucose-Capillary: 91 mg/dL (ref 70–99)

## 2011-03-26 LAB — GLUCOSE, CAPILLARY: Glucose-Capillary: 98 mg/dL (ref 70–99)

## 2011-03-28 LAB — GLUCOSE, CAPILLARY: Glucose-Capillary: 124 mg/dL — ABNORMAL HIGH (ref 70–99)

## 2011-04-02 LAB — GLUCOSE, CAPILLARY: Glucose-Capillary: 99 mg/dL (ref 70–99)

## 2011-04-09 LAB — GLUCOSE, CAPILLARY: Glucose-Capillary: 104 mg/dL — ABNORMAL HIGH (ref 70–99)

## 2011-04-11 LAB — GLUCOSE, CAPILLARY
Glucose-Capillary: 143 mg/dL — ABNORMAL HIGH (ref 70–99)
Glucose-Capillary: 48 mg/dL — ABNORMAL LOW (ref 70–99)

## 2011-04-14 LAB — GLUCOSE, CAPILLARY: Glucose-Capillary: 121 mg/dL — ABNORMAL HIGH (ref 70–99)

## 2011-04-16 LAB — GLUCOSE, CAPILLARY: Glucose-Capillary: 129 mg/dL — ABNORMAL HIGH (ref 70–99)

## 2011-04-18 LAB — GLUCOSE, CAPILLARY: Glucose-Capillary: 102 mg/dL — ABNORMAL HIGH (ref 70–99)

## 2011-04-28 LAB — GLUCOSE, CAPILLARY: Glucose-Capillary: 95 mg/dL (ref 70–99)

## 2011-04-30 LAB — GLUCOSE, CAPILLARY: Glucose-Capillary: 92 mg/dL (ref 70–99)

## 2011-05-02 LAB — GLUCOSE, CAPILLARY: Glucose-Capillary: 111 mg/dL — ABNORMAL HIGH (ref 70–99)

## 2011-05-05 LAB — GLUCOSE, CAPILLARY: Glucose-Capillary: 114 mg/dL — ABNORMAL HIGH (ref 70–99)

## 2011-05-12 LAB — GLUCOSE, CAPILLARY: Glucose-Capillary: 100 mg/dL — ABNORMAL HIGH (ref 70–99)

## 2011-05-19 LAB — GLUCOSE, CAPILLARY: Glucose-Capillary: 103 mg/dL — ABNORMAL HIGH (ref 70–99)

## 2011-05-20 ENCOUNTER — Other Ambulatory Visit: Payer: Self-pay

## 2011-05-20 ENCOUNTER — Encounter (HOSPITAL_COMMUNITY): Payer: Self-pay | Admitting: Emergency Medicine

## 2011-05-20 ENCOUNTER — Emergency Department (HOSPITAL_COMMUNITY)
Admission: EM | Admit: 2011-05-20 | Discharge: 2011-05-21 | Disposition: A | Discharge status: Skilled Nursing Facility | Payer: No Typology Code available for payment source | Attending: Emergency Medicine | Admitting: Emergency Medicine

## 2011-05-20 ENCOUNTER — Emergency Department (HOSPITAL_COMMUNITY): Payer: No Typology Code available for payment source

## 2011-05-20 DIAGNOSIS — F329 Major depressive disorder, single episode, unspecified: Secondary | ICD-10-CM | POA: Insufficient documentation

## 2011-05-20 DIAGNOSIS — J449 Chronic obstructive pulmonary disease, unspecified: Secondary | ICD-10-CM

## 2011-05-20 DIAGNOSIS — J4489 Other specified chronic obstructive pulmonary disease: Secondary | ICD-10-CM | POA: Insufficient documentation

## 2011-05-20 DIAGNOSIS — I10 Essential (primary) hypertension: Secondary | ICD-10-CM | POA: Insufficient documentation

## 2011-05-20 DIAGNOSIS — J189 Pneumonia, unspecified organism: Secondary | ICD-10-CM | POA: Insufficient documentation

## 2011-05-20 DIAGNOSIS — F3289 Other specified depressive episodes: Secondary | ICD-10-CM | POA: Insufficient documentation

## 2011-05-20 DIAGNOSIS — R0602 Shortness of breath: Secondary | ICD-10-CM | POA: Insufficient documentation

## 2011-05-20 DIAGNOSIS — E119 Type 2 diabetes mellitus without complications: Secondary | ICD-10-CM | POA: Insufficient documentation

## 2011-05-20 DIAGNOSIS — R0682 Tachypnea, not elsewhere classified: Secondary | ICD-10-CM | POA: Insufficient documentation

## 2011-05-20 DIAGNOSIS — R0989 Other specified symptoms and signs involving the circulatory and respiratory systems: Secondary | ICD-10-CM | POA: Insufficient documentation

## 2011-05-20 DIAGNOSIS — C3490 Malignant neoplasm of unspecified part of unspecified bronchus or lung: Secondary | ICD-10-CM | POA: Insufficient documentation

## 2011-05-20 DIAGNOSIS — R109 Unspecified abdominal pain: Secondary | ICD-10-CM | POA: Insufficient documentation

## 2011-05-20 DIAGNOSIS — R0609 Other forms of dyspnea: Secondary | ICD-10-CM | POA: Insufficient documentation

## 2011-05-20 DIAGNOSIS — I4891 Unspecified atrial fibrillation: Secondary | ICD-10-CM | POA: Insufficient documentation

## 2011-05-20 DIAGNOSIS — I509 Heart failure, unspecified: Secondary | ICD-10-CM | POA: Insufficient documentation

## 2011-05-20 DIAGNOSIS — K219 Gastro-esophageal reflux disease without esophagitis: Secondary | ICD-10-CM | POA: Insufficient documentation

## 2011-05-20 DIAGNOSIS — Z79899 Other long term (current) drug therapy: Secondary | ICD-10-CM | POA: Insufficient documentation

## 2011-05-20 DIAGNOSIS — F068 Other specified mental disorders due to known physiological condition: Secondary | ICD-10-CM | POA: Insufficient documentation

## 2011-05-20 HISTORY — DX: Malignant (primary) neoplasm, unspecified: C80.1

## 2011-05-20 HISTORY — DX: Gastro-esophageal reflux disease without esophagitis: K21.9

## 2011-05-20 HISTORY — DX: Unspecified atrial fibrillation: I48.91

## 2011-05-20 HISTORY — DX: Disorder of thyroid, unspecified: E07.9

## 2011-05-20 HISTORY — DX: Heart failure, unspecified: I50.9

## 2011-05-20 LAB — DIFFERENTIAL
Basophils Relative: 1 % (ref 0–1)
Eosinophils Absolute: 0 10*3/uL (ref 0.0–0.7)
Eosinophils Relative: 0 % (ref 0–5)
Lymphs Abs: 2.4 10*3/uL (ref 0.7–4.0)
Monocytes Relative: 10 % (ref 3–12)

## 2011-05-20 LAB — COMPREHENSIVE METABOLIC PANEL
BUN: 13 mg/dL (ref 6–23)
Calcium: 8.8 mg/dL (ref 8.4–10.5)
GFR calc Af Amer: >90 mL/min (ref >90)
Glucose, Bld: 99 mg/dL (ref 70–99)
Sodium: 133 mEq/L — ABNORMAL LOW (ref 135–145)
Total Protein: 6.9 g/dL (ref 6.0–8.3)

## 2011-05-20 LAB — CBC
MCH: 26.5 pg (ref 26.0–34.0)
MCHC: 31.5 g/dL (ref 30.0–36.0)
MCV: 84.1 fL (ref 78.0–100.0)
Platelets: 222 10*3/uL (ref 150–400)
RBC: 4.27 MIL/uL (ref 3.87–5.11)

## 2011-05-20 LAB — PROTIME-INR: Prothrombin Time: 21.1 seconds — ABNORMAL HIGH (ref 11.6–15.2)

## 2011-05-20 MED ORDER — ALBUTEROL SULFATE (5 MG/ML) 0.5% IN NEBU: 5.0000 mg | INHALATION_SOLUTION | Freq: Once | RESPIRATORY_TRACT | Status: DC

## 2011-05-20 MED ORDER — IPRATROPIUM BROMIDE 0.02 % IN SOLN
0.5000 mg | Freq: Once | RESPIRATORY_TRACT | Status: AC
  Administered 2011-05-20: 0.5 mg via RESPIRATORY_TRACT
  Filled 2011-05-20: qty 2.5

## 2011-05-20 MED ORDER — ALBUTEROL SULFATE (5 MG/ML) 0.5% IN NEBU
5.0000 mg | INHALATION_SOLUTION | Freq: Once | RESPIRATORY_TRACT | Status: AC
  Administered 2011-05-20: 5 mg via RESPIRATORY_TRACT
  Filled 2011-05-20: qty 1

## 2011-05-20 MED ORDER — SODIUM CHLORIDE 0.9 % IV SOLN
Freq: Once | INTRAVENOUS | Status: AC
  Administered 2011-05-20: 23:00:00 via INTRAVENOUS

## 2011-05-20 MED ORDER — ALBUTEROL SULFATE (5 MG/ML) 0.5% IN NEBU
5.0000 mg | INHALATION_SOLUTION | Freq: Once | RESPIRATORY_TRACT | Status: AC
  Administered 2011-05-20: 5 mg via RESPIRATORY_TRACT
  Filled 2011-05-20: qty 0.5

## 2011-05-20 NOTE — ED Notes (Signed)
Patient presents to ER via wheelchair from the [facility] with c/o abdominal pain x 4 days.  Patient states she has COPD and has been coughing for quite a while.

## 2011-05-21 ENCOUNTER — Inpatient Hospital Stay
Admission: RE | Admit: 2011-05-21 | Discharge: 2011-06-13 | DRG: 948 | Disposition: A | Discharge status: Hospice | Payer: No Typology Code available for payment source | Source: Ambulatory Visit | Attending: Internal Medicine | Admitting: Internal Medicine

## 2011-05-21 DIAGNOSIS — Z139 Encounter for screening, unspecified: Secondary | ICD-10-CM | POA: Diagnosis present

## 2011-05-21 LAB — GLUCOSE, CAPILLARY: Glucose-Capillary: 109 mg/dL — ABNORMAL HIGH (ref 70–99)

## 2011-05-21 MED ORDER — IPRATROPIUM BROMIDE 0.02 % IN SOLN
0.5000 mg | Freq: Once | RESPIRATORY_TRACT | Status: AC
  Administered 2011-05-21: 0.5 mg via RESPIRATORY_TRACT
  Filled 2011-05-21: qty 2.5

## 2011-05-21 MED ORDER — MORPHINE SULFATE 4 MG/ML IJ SOLN
4.0000 mg | Freq: Once | INTRAMUSCULAR | Status: AC
  Administered 2011-05-21: 4 mg via INTRAVENOUS
  Filled 2011-05-21: qty 1

## 2011-05-21 MED ORDER — VITAMIN K1 10 MG/ML IJ SOLN
INTRAMUSCULAR | Status: AC
  Filled 2011-05-21: qty 1

## 2011-05-21 MED ORDER — ALBUTEROL SULFATE (5 MG/ML) 0.5% IN NEBU
5.0000 mg | INHALATION_SOLUTION | Freq: Once | RESPIRATORY_TRACT | Status: AC
  Administered 2011-05-21: 5 mg via RESPIRATORY_TRACT
  Filled 2011-05-21: qty 1

## 2011-05-21 MED ORDER — VANCOMYCIN HCL IN DEXTROSE 1-5 GM/200ML-% IV SOLN
1000.0000 mg | Freq: Once | INTRAVENOUS | Status: DC
  Filled 2011-05-21: qty 200

## 2011-05-21 MED ORDER — MORPHINE SULFATE 4 MG/ML IJ SOLN: 4.0000 mg | Freq: Once | INTRAMUSCULAR | Status: DC

## 2011-05-21 MED ORDER — VITAMIN K1 10 MG/ML IJ SOLN
10.0000 mg | Freq: Once | INTRAVENOUS | Status: AC
  Administered 2011-05-21: 10 mg via INTRAVENOUS
  Filled 2011-05-21: qty 1

## 2011-05-21 MED ORDER — PIPERACILLIN-TAZOBACTAM 3.375 G IVPB
3.3750 g | Freq: Once | INTRAVENOUS | Status: AC
  Administered 2011-05-21: 3.375 g via INTRAVENOUS
  Filled 2011-05-21: qty 50

## 2011-05-21 NOTE — ED Notes (Signed)
House Supervisor called for [facility]

## 2011-05-21 NOTE — ED Provider Notes (Signed)
Dr. Orvan Falconer spoke with pt and it was decided the pt could be discharged [facility] treated at the [facility].  Madison Lennert, MD 05/21/11 [phone]

## 2011-05-21 NOTE — ED Provider Notes (Signed)
History     CSN: [id number]  Arrival date & time 05/20/11  2151   First MD Initiated Contact with Patient 05/20/11 2339      Chief Complaint  Patient presents with  . Abdominal Pain  . Hemoptysis    (Consider location/radiation/quality/duration/timing/severity/associated sxs/prior treatment) HPI Comments: Pt has end stage COPD and was diagnosed with lung CA ~ 2 months ago.  She has opted for no treatment and has a valid DNR.  She has become increasingly SOB and coughing up blood over the past 3 weeks..  She denies fever or chills.  She is a pt of dr. Leanord Sanford.  She has come to the ED tonight for dyspnea and discomfort.  i asked about the chief complaint of abdominal pain.  She denies having pain other in the area of her? Inguinal hernias.  No n/v/d.  Patient is a 78 y.o. female presenting with abdominal pain and shortness of breath. The history is provided by the patient. No language interpreter was used.  Abdominal Pain The primary symptoms of the illness include abdominal pain and shortness of breath. The primary symptoms of the illness do not include fever, nausea, vomiting or diarrhea.  Symptoms associated with the illness do not include chills.  Shortness of Breath  The onset was gradual. The problem has been gradually worsening. The problem is severe. The symptoms are relieved by beta-agonist inhalers. Associated symptoms include cough and shortness of breath. Pertinent negatives include no chest pain and no fever. She has been behaving normally. Urine output has been normal. There were no sick contacts.    Past Medical History  Diagnosis Date  . Hypertension   . Dementia   . COPD (chronic obstructive pulmonary disease)   . Depression   . Chronic respiratory failure with hypoxia   . Lung mass   . Anemia 10/24/2010  . Cancer   . Atrial fibrillation   . CHF (congestive heart failure)   . Diabetes mellitus   . Thyroid disease   . GERD (gastroesophageal reflux disease)      History reviewed. No pertinent past surgical history.  No family history on file.  History  Substance Use Topics  . Smoking status: Former Smoker -- 1.5 packs/day for 40 years    Types: Cigarettes  . Smokeless tobacco: Not on file  . Alcohol Use: No    OB History    Grav Para Term Preterm Abortions TAB SAB Ect Mult Living                  Review of Systems  Constitutional: Negative for fever and chills.  Respiratory: Positive for cough and shortness of breath.   Cardiovascular: Negative for chest pain.  Gastrointestinal: Positive for abdominal pain. Negative for nausea, vomiting, diarrhea and blood in stool.  All other systems reviewed and are negative.    Allergies  Codeine  Home Medications   Current Outpatient Rx  Name Route Sig Dispense Refill  . ALPRAZOLAM 0.25 MG PO TABS Oral Take 0.25 mg by mouth 2 (two) times daily.    . BUDESONIDE 0.25 MG/2ML IN SUSP Nebulization Take 0.25 mg by nebulization 2 (two) times daily.      . BUPROPION HCL ER (XL) 150 MG PO TB24 Oral Take 150 mg by mouth daily.     . CYCLOSPORINE 0.05 % OP EMUL Both Eyes Place 1 drop into both eyes 2 (two) times daily.     Marland Kitchen DIGOXIN 0.25 MG PO TABS Oral Take 250 mcg by  mouth daily. For Atrial fibrillation    . DILTIAZEM HCL ER COATED BEADS 240 MG PO CP24 Oral Take 240 mg by mouth daily. For Atrial fibrillation    . DOXEPIN HCL 10 MG PO CAPS Oral Take 10 mg by mouth at bedtime.     . FUROSEMIDE 40 MG PO TABS Oral Take 40 mg by mouth daily.    . GUAIFENESIN ER 600 MG PO TB12 Oral Take 1,200 mg by mouth 2 (two) times daily.      . GUAIFENESIN-DM 100-10 MG/5ML PO SYRP Oral Take 10 mLs by mouth every 4 (four) hours.    Marland Kitchen LEVALBUTEROL HCL 1.25 MG/3ML IN NEBU Nebulization Take 1 ampule by nebulization every 6 (six) hours.     Marland Kitchen LIDOCAINE 5 % EX PTCH Transdermal Place 1 patch onto the skin daily. Apply to shoulder. Remove & Discard patch within 12 hours or as directed by MD    . METOPROLOL TARTRATE 25 MG  PO TABS Oral Take 25 mg by mouth 2 (two) times daily.      . ADULT MULTIVITAMIN W/MINERALS CH Oral Take 1 tablet by mouth daily.    Marland Kitchen ENLIVE PO Oral Take by mouth 2 (two) times daily.      Marland Kitchen FISH OIL 1200 MG PO CAPS Oral Take 1 capsule by mouth daily.    Marland Kitchen OMEPRAZOLE 40 MG PO CPDR Oral Take 40 mg by mouth daily.      . OXYCODONE HCL ER 40 MG PO TB12 Oral Take 40 mg by mouth every 12 (twelve) hours.      Marland Kitchen POLYETHYLENE GLYCOL 3350 PO POWD Oral Take 17 g by mouth daily.      Marland Kitchen POTASSIUM CHLORIDE CRYS ER 20 MEQ PO TBCR Oral Take 20 mEq by mouth 2 (two) times daily.    Marland Kitchen PRIMIDONE 50 MG PO TABS Oral Take 25 mg by mouth 3 (three) times daily.     Marland Kitchen PROBIOTIC FORMULA PO Oral Take 1 tablet by mouth 2 (two) times daily.    Madison Sanford SODIUM 8.6-50 MG PO TABS Oral Take 1 tablet by mouth 2 (two) times daily. **Hold for Diarrhea**    . SERTRALINE HCL 100 MG PO TABS Oral Take 100 mg by mouth daily. Along with Zoloft 50mg  to equal a total dose of 150mg  daily     . SERTRALINE HCL 50 MG PO TABS Oral Take 50 mg by mouth daily. Take with Zoloft 100mg  to equal a total daily dose of 150mg  daily     . TIOTROPIUM BROMIDE MONOHYDRATE 18 MCG IN CAPS Inhalation Place 18 mcg into inhaler and inhale daily.     . ACETAMINOPHEN 325 MG PO TABS Oral Take 650 mg by mouth every 4 (four) hours as needed. For pain        BP 133/55  Pulse 65  Temp(Src) 98.2 F (36.8 C) (Oral)  Resp 24  Wt 130 lb (58.968 kg)  SpO2 96%  Physical Exam  Nursing note and vitals reviewed. Constitutional: She is oriented to person, place, and time. She appears well-developed and well-nourished. No distress.  HENT:  Head: Normocephalic and atraumatic.  Eyes: EOM are normal.  Neck: Normal range of motion.  Cardiovascular: Normal rate, regular rhythm and normal heart sounds.   Pulmonary/Chest: Accessory muscle usage present. Tachypnea noted. She is in respiratory distress. She has no decreased breath sounds. She has no wheezes. She  has rhonchi in the right upper field, the left upper field, the left middle field and  the left lower field. She has no rales.       Increased AP diameter and prolonged expiratory effort.  Abdominal: Soft. She exhibits no distension. There is no tenderness.  Musculoskeletal: Normal range of motion.  Neurological: She is alert and oriented to person, place, and time.  Skin: Skin is warm and dry.  Psychiatric: She has a normal mood and affect. Judgment normal.    ED Course  Procedures (including critical care time)  Labs Reviewed  CBC - Abnormal; Notable for the following:    Hemoglobin 11.3 (*)    HCT 35.9 (*)    All other components within normal limits  COMPREHENSIVE METABOLIC PANEL - Abnormal; Notable for the following:    Sodium 133 (*)    Chloride 94 (*)    CO2 37 (*)    Creatinine, Ser 0.48 (*)    Total Bilirubin 0.2 (*)    All other components within normal limits  PROTIME-INR - Abnormal; Notable for the following:    Prothrombin Time 21.1 (*)    INR 1.79 (*)    All other components within normal limits  DIFFERENTIAL  CULTURE, BLOOD (ROUTINE X 2)  CULTURE, BLOOD (ROUTINE X 2)   Dg Chest 2 View  05/20/2011  *RADIOLOGY REPORT*  Clinical Data: Dark hemoptysis for 3 weeks; history of lung cancer and smoking.  CHEST - 2 VIEW  Comparison: Chest radiograph performed 03/21/2011  Findings: The patient's left-sided lung mass has increased significantly in size, now apparently occupying much of the left anterior lung.  This is difficult to distinguish from adjacent mediastinum and airspace consolidation.  A small left pleural effusion has increased in size from the prior study.  No pneumothorax is seen.  The heart is borderline enlarged; calcification is noted within the aortic arch.  Bilateral breast implants are noted.  No acute osseous abnormalities are seen.  IMPRESSION:  1.  Significant interval increase in size of left-sided lung mass, now apparently occupying much of the left  anterior lung.  This is difficult to distinguish from adjacent mediastinum; underlying pneumonia cannot be excluded.  Increased small left pleural effusion seen. 2.  Borderline cardiomegaly.  Original Report Authenticated By: Tonia Ghent, M.D.     1. Lung cancer   2. COPD (chronic obstructive pulmonary disease)   3. Healthcare-associated pneumonia   4. Hemoptysis     Date: 05/21/2011  Rate: 67  Rhythm: normal sinus rhythm  QRS Axis: normal  Intervals: normal  ST/T Wave abnormalities: normal  Conduction Disutrbances:none  Narrative Interpretation:   Old EKG Reviewed: unchanged   MDM  Pt  Being admitted for palliative care.  She understands that she will die soon.  "i am ready for it.  i just want to be more comfortable.        Worthy Rancher, PA 05/21/11 0054  Worthy Rancher, PA 05/21/11 [phone]

## 2011-05-21 NOTE — Consult Note (Addendum)
PCP: Baltazar Najjar, MD Referring Physician: Zerita Boers, MD Reason for Consult: Hemoptysis, SOB  Code Status: DNR  Madison Sanford is an 78 y.o. female.   Assessment:  1) Elderly Caucasian lady alert and oriented x4; very well aware of her medical problems and her prognoses, and anxious to have her life end as soon as possible in a comfortable way. 2) Rapidly enlarging left lung mass probably lung cancer, declining any active treatment; 3) Iatrogenic coagulopathy from Coumadin treatment for  atrial flutter 4) Hemoptysis secondary to the above 5) Paroxysmal atrial fibrillation atrial or atrial flutter 6) COPD 7) Hypertension 8) Diabetes  Recommendations:  - Intravenous vitamin K to reverse Coumadin coagulopathy.(done)  - Explain the cause of the hemoptysis to the patient (done) -  Explain the need to reverse the Coumadin and discontinue Coumadin altogether (done) -  Explain the possible consequences of discontinuing Coumadin (done)   - Since patient is already very comfortable with the concept of dying, and understands that she can die from any of her multiple medical problems, it would be good  to  discussed the most comfortable way to live. She needs to be referred back to the nursing home continue serial nebulizations as needed,  and since this won't be enough to control the breathing difficulties associated with this rapidly progressing mass, she will also need Ativan and morphine when necessary;  she would be better managed under the hospice service at the nursing home.  - There is no indication for admission for this patient, since there is no service we can offer in the hospital but cannot be offered at the nursing home. Patient is under the impression that we will give her some medication to put her to sleep so that she won't have to wake up. However, at this time we are not offering this service.     MEDS GIVEN IN ED Medications Prior to Admission  Medication Dose Route  Frequency Provider Last Rate Last Dose  . 0.9 %  sodium chloride infusion   Intravenous Once Worthy Rancher, PA 20 mL/hr at 05/20/11 2323    . albuterol (PROVENTIL) (5 MG/ML) 0.5% nebulizer solution 5 mg  5 mg Nebulization Once Flint Melter, MD   5 mg at 05/20/11 2236  . albuterol (PROVENTIL) (5 MG/ML) 0.5% nebulizer solution 5 mg  5 mg Nebulization Once Worthy Rancher, PA   5 mg at 05/20/11 2349  . ipratropium (ATROVENT) nebulizer solution 0.5 mg  0.5 mg Nebulization Once Flint Melter, MD   0.5 mg at 05/20/11 2236  . ipratropium (ATROVENT) nebulizer solution 0.5 mg  0.5 mg Nebulization Once Worthy Rancher, PA   0.5 mg at 05/20/11 2349  . phytonadione (VITAMIN K) 10 mg in dextrose 5 % 50 mL IVPB  10 mg Intravenous Once Vania Rea, MD      . piperacillin-tazobactam (ZOSYN) IVPB 3.375 g  3.375 g Intravenous Once Worthy Rancher, PA   3.375 g at 05/21/11 0043  . vancomycin (VANCOCIN) IVPB 1000 mg/200 mL premix  1,000 mg Intravenous Once Worthy Rancher, PA      . DISCONTD: albuterol (PROVENTIL) (5 MG/ML) 0.5% nebulizer solution 5 mg  5 mg Nebulization Once Worthy Rancher, PA       Medications Prior to Admission  Medication Sig Dispense Refill  . budesonide (PULMICORT) 0.25 MG/2ML nebulizer solution Take 0.25 mg by nebulization 2 (two) times daily.        Marland Kitchen buPROPion (WELLBUTRIN XL) 150  MG 24 hr tablet Take 150 mg by mouth daily.       . cycloSPORINE (RESTASIS) 0.05 % ophthalmic emulsion Place 1 drop into both eyes 2 (two) times daily.       . digoxin (LANOXIN) 0.25 MG tablet Take 250 mcg by mouth daily. For Atrial fibrillation      . diltiazem (CARDIZEM CD) 240 MG 24 hr capsule Take 240 mg by mouth daily. For Atrial fibrillation      . doxepin (SINEQUAN) 10 MG capsule Take 10 mg by mouth at bedtime.       Marland Kitchen guaiFENesin (MUCINEX) 600 MG 12 hr tablet Take 1,200 mg by mouth 2 (two) times daily.        Marland Kitchen levalbuterol (XOPENEX) 1.25 MG/3ML nebulizer solution Take 1 ampule by  nebulization every 6 (six) hours.       . lidocaine (LIDODERM) 5 % Place 1 patch onto the skin daily. Apply to shoulder. Remove & Discard patch within 12 hours or as directed by MD      . metoprolol tartrate (LOPRESSOR) 25 MG tablet Take 25 mg by mouth 2 (two) times daily.        . Nutritional Supplements (ENLIVE PO) Take by mouth 2 (two) times daily.        Marland Kitchen omeprazole (PRILOSEC) 40 MG capsule Take 40 mg by mouth daily.        Marland Kitchen oxyCODONE (OXYCONTIN) 40 MG 12 hr tablet Take 40 mg by mouth every 12 (twelve) hours.        . polyethylene glycol powder (GLYCOLAX/MIRALAX) powder Take 17 g by mouth daily.        . primidone (MYSOLINE) 50 MG tablet Take 25 mg by mouth 3 (three) times daily.       . Probiotic Product (PROBIOTIC FORMULA PO) Take 1 tablet by mouth 2 (two) times daily.      Marland Kitchen senna-docusate (SENOKOT S) 8.6-50 MG per tablet Take 1 tablet by mouth 2 (two) times daily. **Hold for Diarrhea**      . sertraline (ZOLOFT) 100 MG tablet Take 100 mg by mouth daily. Along with Zoloft 50mg  to equal a total dose of 150mg  daily       . sertraline (ZOLOFT) 50 MG tablet Take 50 mg by mouth daily. Take with Zoloft 100mg  to equal a total daily dose of 150mg  daily       . tiotropium (SPIRIVA) 18 MCG inhalation capsule Place 18 mcg into inhaler and inhale daily.       Marland Kitchen acetaminophen (TYLENOL) 325 MG tablet Take 650 mg by mouth every 4 (four) hours as needed. For pain        . amoxicillin-clavulanate (AUGMENTIN) 500-125 MG per tablet Take 1 tablet by mouth 2 (two) times daily.       History reviewed. No pertinent past surgical history.   Allergies:  Allergies  Allergen Reactions  . Codeine Nausea Only    Social History:  reports that she has quit smoking. Her smoking use included Cigarettes. She has a 60 pack-year smoking history. She does not have any smokeless tobacco history on file. She reports that she does not drink alcohol. Her drug history not on file.  No family history on  file.  @ROS @  Blood pressure 133/55, pulse 65, temperature 98.2 F (36.8 C), temperature source Oral, resp. rate 24, weight 58.968 kg (130 lb), SpO2 96.00%. @PHYSEXAMBYAGE2 @   Results for orders placed during the hospital encounter of 05/20/11 (from the past 48 hour(s))  CBC     Status: Abnormal   Collection Time   05/20/11 11:04 PM      Component Value Range Comment   WBC 7.3  4.0 - 10.5 (K/uL)    RBC 4.27  3.87 - 5.11 (MIL/uL)    Hemoglobin 11.3 (*) 12.0 - 15.0 (g/dL)    HCT 19.1 (*) 47.8 - 46.0 (%)    MCV 84.1  78.0 - 100.0 (fL)    MCH 26.5  26.0 - 34.0 (pg)    MCHC 31.5  30.0 - 36.0 (g/dL)    RDW 29.5  62.1 - 30.8 (%)    Platelets 222  150 - 400 (K/uL)   DIFFERENTIAL     Status: Normal   Collection Time   05/20/11 11:04 PM      Component Value Range Comment   Neutrophils Relative 57  43 - 77 (%)    Neutro Abs 4.1  1.7 - 7.7 (K/uL)    Lymphocytes Relative 33  12 - 46 (%)    Lymphs Abs 2.4  0.7 - 4.0 (K/uL)    Monocytes Relative 10  3 - 12 (%)    Monocytes Absolute 0.7  0.1 - 1.0 (K/uL)    Eosinophils Relative 0  0 - 5 (%)    Eosinophils Absolute 0.0  0.0 - 0.7 (K/uL)    Basophils Relative 1  0 - 1 (%)    Basophils Absolute 0.1  0.0 - 0.1 (K/uL)   COMPREHENSIVE METABOLIC PANEL     Status: Abnormal   Collection Time   05/20/11 11:04 PM      Component Value Range Comment   Sodium 133 (*) 135 - 145 (mEq/L)    Potassium 4.5  3.5 - 5.1 (mEq/L)    Chloride 94 (*) 96 - 112 (mEq/L)    CO2 37 (*) 19 - 32 (mEq/L)    Glucose, Bld 99  70 - 99 (mg/dL)    BUN 13  6 - 23 (mg/dL)    Creatinine, Ser 6.57 (*) 0.50 - 1.10 (mg/dL)    Calcium 8.8  8.4 - 10.5 (mg/dL)    Total Protein 6.9  6.0 - 8.3 (g/dL)    Albumin 3.5  3.5 - 5.2 (g/dL)    AST 25  0 - 37 (U/L)    ALT 19  0 - 35 (U/L)    Alkaline Phosphatase 82  39 - 117 (U/L)    Total Bilirubin 0.2 (*) 0.3 - 1.2 (mg/dL)    GFR calc non Af Amer >90  >90 (mL/min)    GFR calc Af Amer >90  >90 (mL/min)   PROTIME-INR     Status:  Abnormal   Collection Time   05/20/11 11:04 PM      Component Value Range Comment   Prothrombin Time 21.1 (*) 11.6 - 15.2 (seconds)    INR 1.79 (*) 0.00 - 1.49      Dg Chest 2 View  05/20/2011  *RADIOLOGY REPORT*  Clinical Data: Dark hemoptysis for 3 weeks; history of lung cancer and smoking.  CHEST - 2 VIEW  Comparison: Chest radiograph performed 03/21/2011  Findings: The patient's left-sided lung mass has increased significantly in size, now apparently occupying much of the left anterior lung.  This is difficult to distinguish from adjacent mediastinum and airspace consolidation.  A small left pleural effusion has increased in size from the prior study.  No pneumothorax is seen.  The heart is borderline enlarged; calcification is noted within the aortic arch.  Bilateral breast  implants are noted.  No acute osseous abnormalities are seen.  IMPRESSION:  1.  Significant interval increase in size of left-sided lung mass, now apparently occupying much of the left anterior lung.  This is difficult to distinguish from adjacent mediastinum; underlying pneumonia cannot be excluded.  Increased small left pleural effusion seen. 2.  Borderline cardiomegaly.  Original Report Authenticated By: Tonia Ghent, M.D.      Dede Dobesh 05/21/2011, 1:19 AM

## 2011-05-21 NOTE — ED Notes (Signed)
Dr. Orvan Falconer here and recommends pt to go back to [facility]. Vitamin K infusion ordered.

## 2011-05-21 NOTE — ED Notes (Signed)
Pt dressed to return to [facility]. Breathing became labored with audible wheezes. Pt speaks short phrases. Resp paged

## 2011-05-21 NOTE — ED Provider Notes (Signed)
Medical screening examination/treatment/procedure(s) were performed by non-physician practitioner and as supervising physician I was immediately available for consultation/collaboration.   Kaisa Wofford L Shaquia Berkley, MD 05/21/11 0406 

## 2011-05-21 NOTE — Discharge Instructions (Signed)
Cancer, General Information  Cancer is a group of many related diseases that begin in cells. Cells are the building blocks of the body. Cells are also the basic unit of life. The body is made up of many types of cells. Normally, cells grow and divide to produce more cells only when the body needs them. This orderly process helps keep the body healthy. Sometimes cells keep dividing when new cells are not needed. These extra cells may form a mass of tissue. It is called a growth or tumor. Tumors can be either:   Not cancerous (benign).   Cancerous (malignant).  Cancer can begin in any organ or tissue of the body. The original tumor is where the tumor started out. It is called the primary cancer or primary tumor. Usually it is named for the part of the body in which it begins.  Metastases mean the spread of cancer. Cancer cells can break away from a primary tumor and travel locally. These grow next to the tumor. Cancer cells can also travel through the bloodstream or lymphatic system to other parts of the body. Cancer cells may spread to lymph nodes near the primary tumor regional lymph nodes, so called because they are in the region. This is called:   Nodal involvement.   Positive nodes.   Regional disease.  Cancer cells can also spread to other parts of the body, distant from the primary tumor. Doctors use the term metastatic disease or distant disease to describe cancer that spreads to other organs or to lymph nodes other than those near the primary tumor.  When cancer cells spread and form a new tumor, the new tumor is called a secondary, or metastatic, tumor. The cancer cells that form the secondary tumor are like those in the original tumor. That means, for example, that if breast cancer spreads (metastasizes) to the lung, the secondary tumor is made up of abnormal breast cells. It is not made of abnormal lung cells. The disease in the lung is metastatic breast cancer. It is not lung cancer.  A metastasis is  a tumor that started from a cancer cell or cells in another part of the body. But sometimes a primary cancer is discovered only after a metastasis causes problems (symptoms). For example, a man whose prostate cancer has spread to the bones in the pelvis may have lower back pain (caused by the cancer in his bones) before experiencing any symptoms from the prostate tumor itself.  The cells in a metastatic tumor resemble those in the primary tumor. The cancerous tissue is examined under a microscope to determine the cell type. Then a doctor can usually tell whether that type of cell is normally found in the part of the body from which the tissue sample was taken.  For instance, breast cancer cells look the same whether they are found in the breast or have spread to another part of the body. So, if a tissue sample taken from a tumor in the lung contains cells that look like breast cells, the doctor determines that the lung tumor is a secondary tumor. Metastatic cancers may be found at the same time as the primary tumor, or months or years later. When a second tumor is found in a patient who has been treated for cancer in the past, it is more often a metastasis than another primary tumor. In a small number of cancer patients, a secondary tumor is diagnosed, but no primary cancer can be found, in spite of extensive   tests. Doctors refer to the primary tumor as unknown or occult. The patient is said tohave cancer of unknown primary origin (CUP). This means we do not know where the tumor came from.  TREATMENT   When cancer has metastasized, it may be treated with:   Chemotherapy.   Radiation therapy.   Biologic therapy.   Hormone therapy.   Surgery.   A combination of these.  The choice of treatment generally depends on the:   Type of primary cancer.   Size and location of the metastasis.   Patient's age and general health.   Types of treatments used previously.  In patients diagnosed with CUP, it is still possible  to treat the disease even when the primary tumor cannot be located. It is also possible to treat the cancer even if the organ from which the cancer cells came cannot be identified.  New cancer treatments are currently under study. To develop new treatments, the National Cancer Institute (NCI) sponsors clinical trials (research studies) with cancer patients in many hospitals, universities, medical schools, and cancer centers around the country. Clinical trials are a critical step in the improvement of treatment. Before any new treatment can be recommended for general use, doctors conduct studies to find out whether the treatment is both safe for patients and effective against the disease. The results of such studies have led to progress not only in the treatment of cancer, but in the detection, diagnosis, improvement of survival rates, and prevention of the disease. Patients interested in participating in research should ask their doctor to find out whether they are eligible for a clinical trial.  FOR MORE INFORMATION  http://www.cancer.gov  Document Released: 02/10/2005 Document Revised: 01/30/2011 Document Reviewed: 10/29/2006  [employer] Patient Information 2012 [employer]

## 2011-05-23 LAB — GLUCOSE, CAPILLARY: Glucose-Capillary: 122 mg/dL — ABNORMAL HIGH (ref 70–99)

## 2011-05-26 LAB — GLUCOSE, CAPILLARY: Glucose-Capillary: 135 mg/dL — ABNORMAL HIGH (ref 70–99)

## 2011-05-27 LAB — CULTURE, BLOOD (ROUTINE X 2)

## 2011-05-28 LAB — GLUCOSE, CAPILLARY: Glucose-Capillary: 134 mg/dL — ABNORMAL HIGH (ref 70–99)

## 2011-05-30 LAB — GLUCOSE, CAPILLARY: Glucose-Capillary: 95 mg/dL (ref 70–99)

## 2011-06-25 DEATH — deceased

## 2011-08-22 ENCOUNTER — Emergency Department (HOSPITAL_COMMUNITY)
Admission: EM | Admit: 2011-08-22 | Discharge: 2011-08-22 | Disposition: A | Discharge status: Home / Self Care | Payer: Medicare Other

## 2011-10-15 IMAGING — CR DG CHEST 2V
2 series · 2 of 2 positions shown · non-contrast
Comparison: Chest 03/29/2009, 03/27/2009 and 12/05/2008.

CLINICAL DATA: Cough and wheezing.

CHEST - 2 VIEW

[view not recorded (1 of 2)]
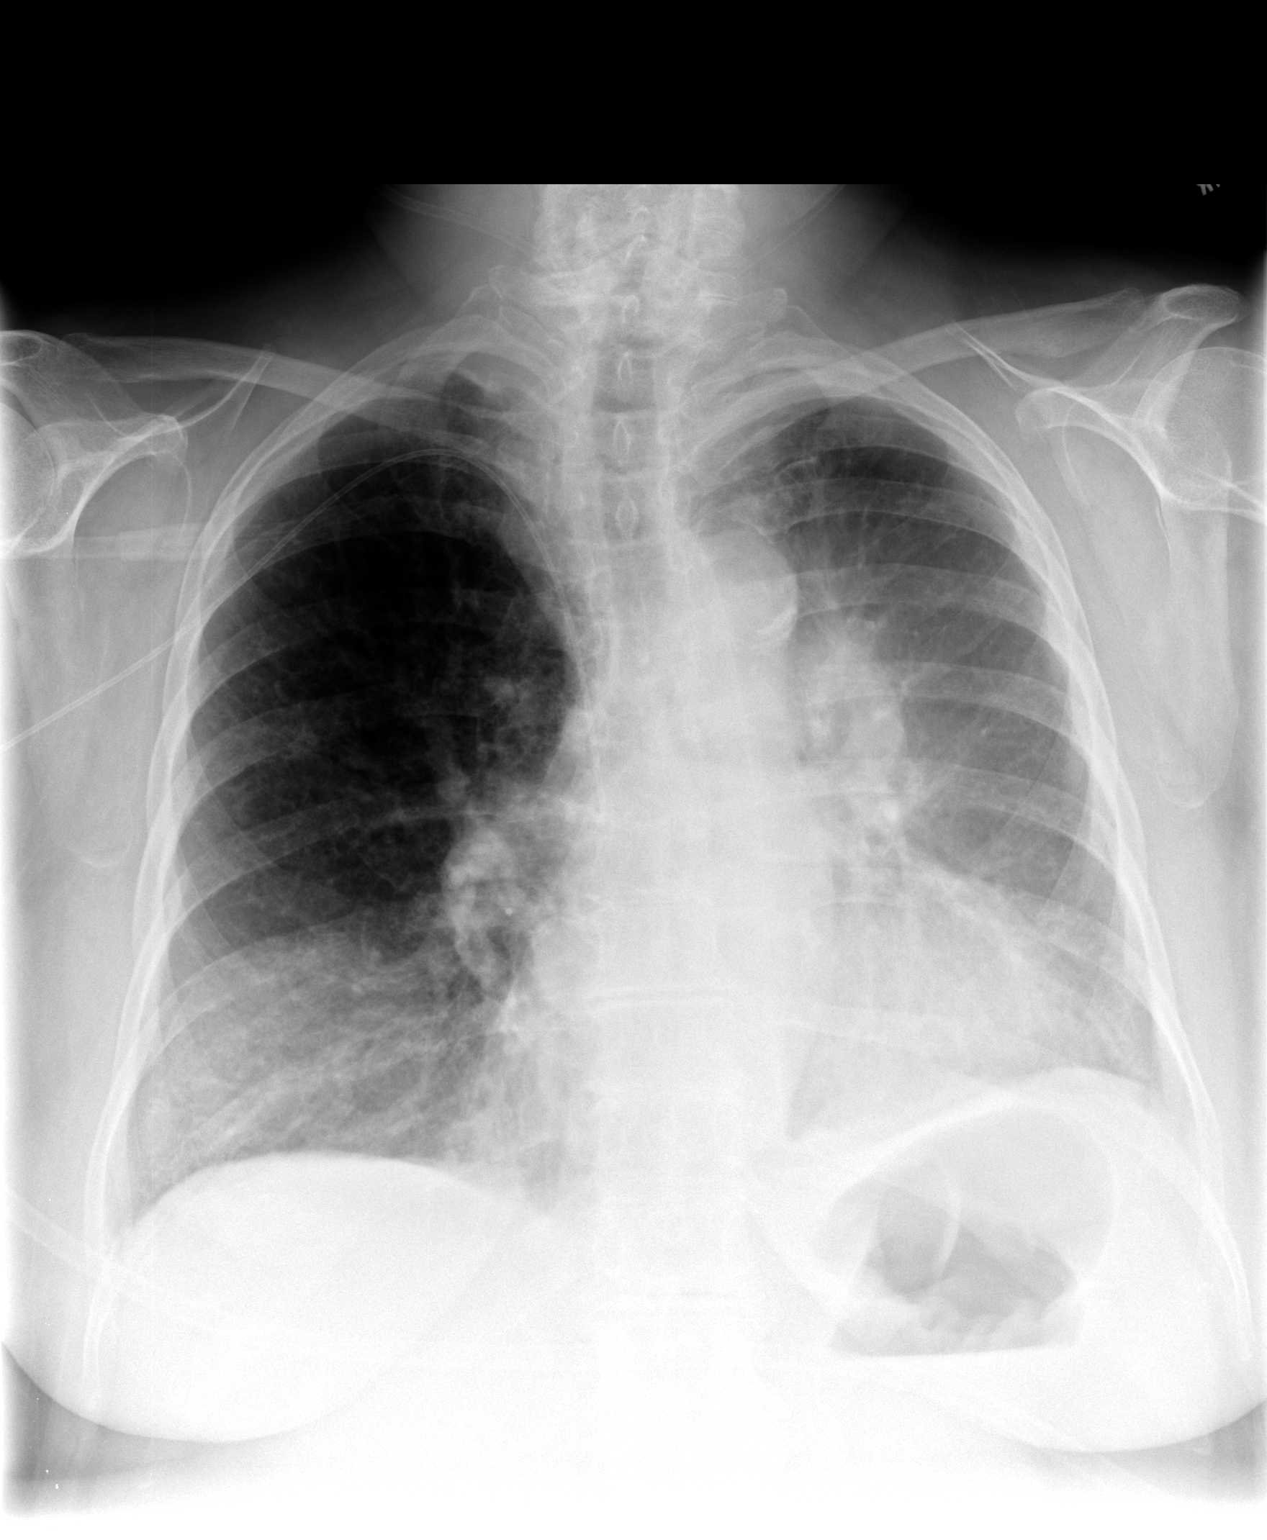

[view not recorded (2 of 2)]
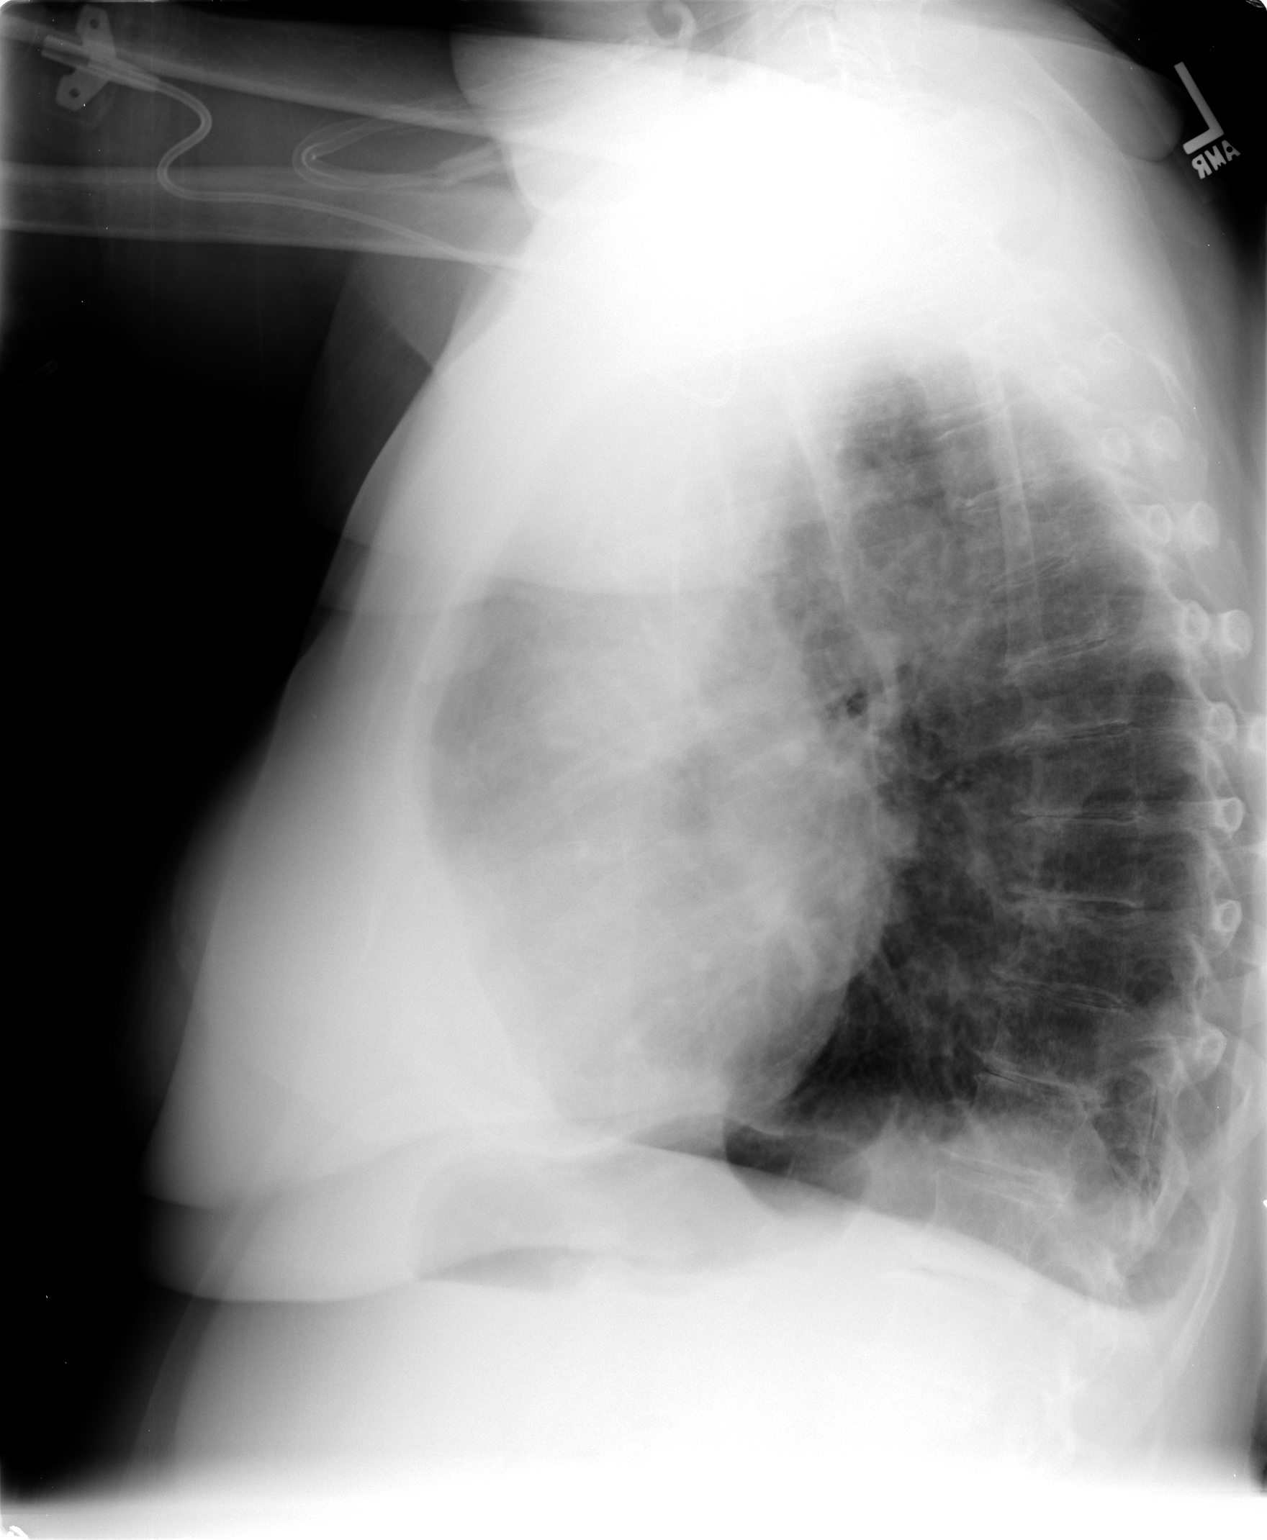

[2 of 2 positions shown; findings below may reference images not displayed]

FINDINGS: Right PICC remains in place.  The patient has small
bilateral pleural effusions.  There is some left basilar airspace
disease.  Lungs appear emphysematous.  Heart size is enlarged.
IMPRESSION: 1.  Small bilateral pleural effusions and left basilar airspace
disease most compatible with atelectasis or possibly pneumonia.
2.  Emphysema.
3.  Cardiomegaly.

## 2011-12-27 IMAGING — CR DG CHEST 1V
1 series · 1 of 1 positions shown · non-contrast
Comparison: Portable chest x-ray of 05/10/2009

CLINICAL DATA: Shortness of breath, cough

CHEST - 1 VIEW

[view not recorded]
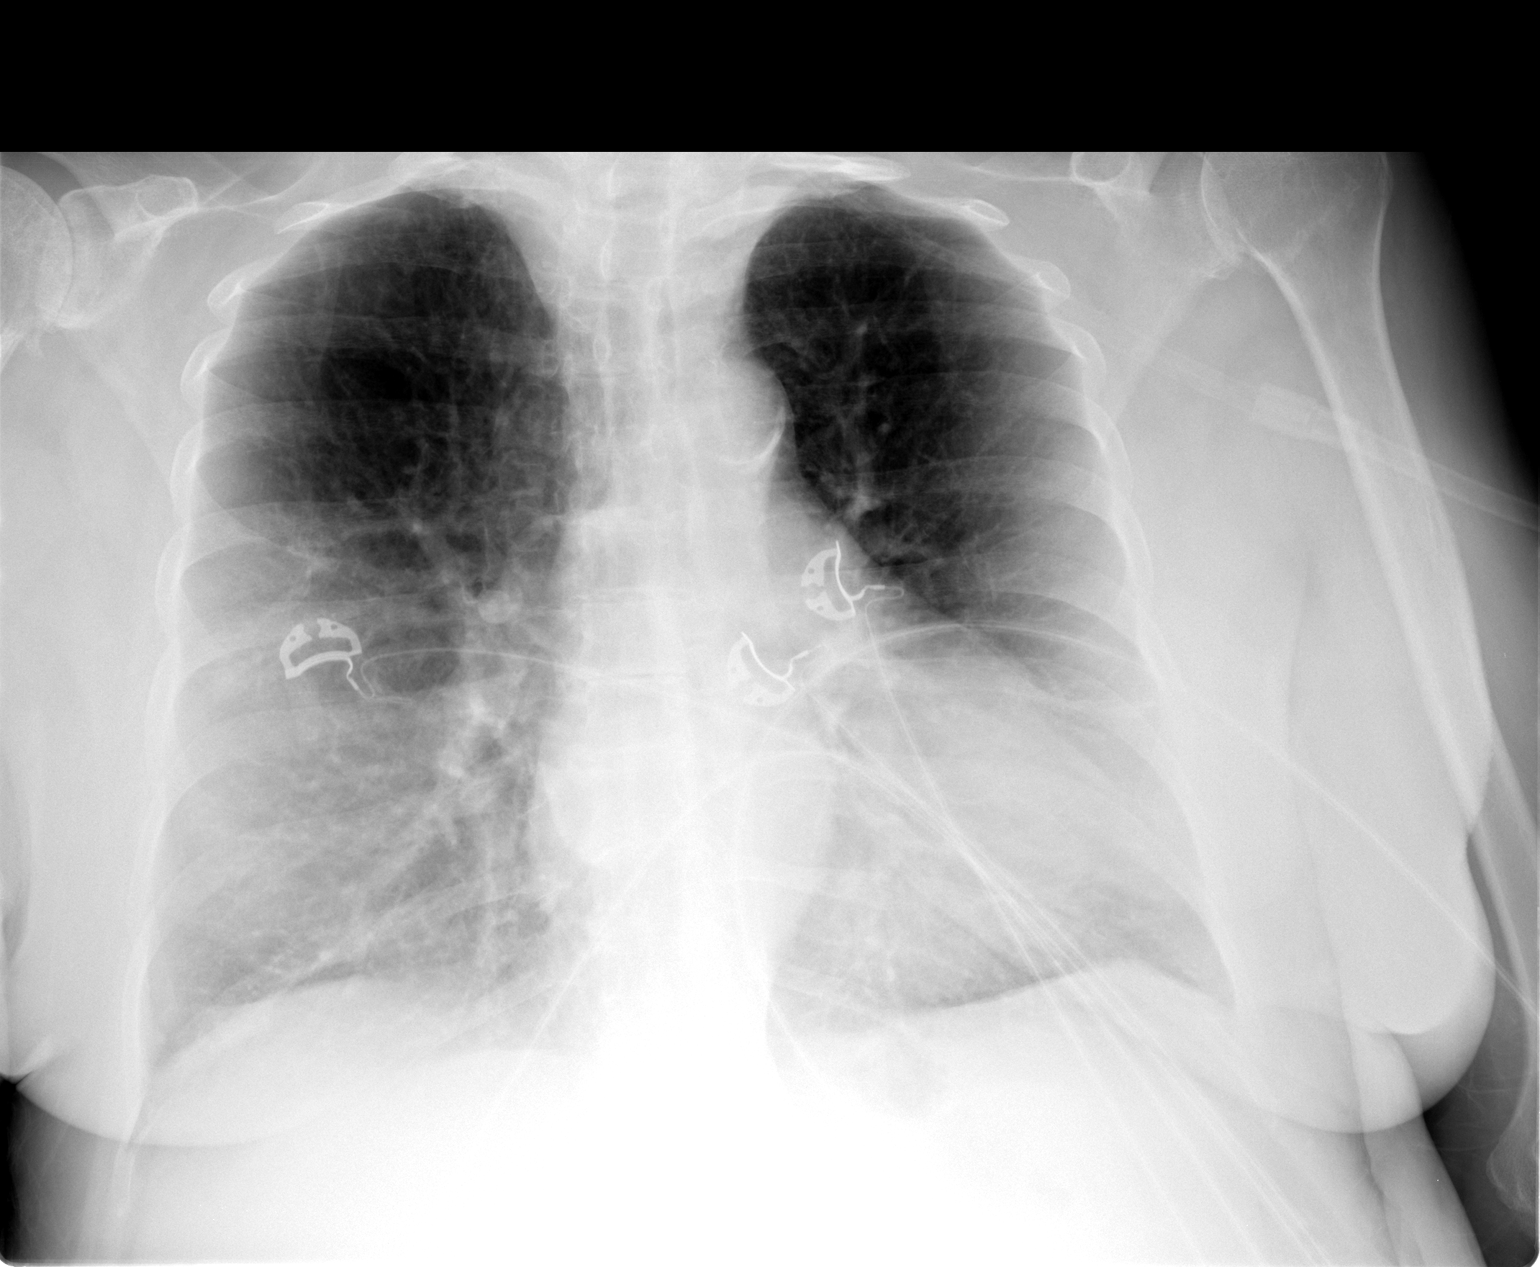

[1 of 1 positions shown; findings below may reference images not displayed]

FINDINGS: The lungs are clear and hyperaerated.  Mediastinal
contours are stable.  The heart is mildly enlarged and stable.  No
bony abnormality is seen.
IMPRESSION: No active lung disease.  Stable hyperaeration and cardiomegaly.

## 2012-01-04 IMAGING — US US EXTREM LOW VENOUS*R*
1 series · 14 of 24 positions shown · non-contrast
Comparison: None

CLINICAL DATA: Right leg edema

RIGHT LOWER EXTREMITY VENOUS DOPPLER ULTRASOUND
TECHNIQUE: Gray-scale sonography with compression, as well as color
and duplex ultrasound, were performed to evaluate the deep venous
system from the level of the common femoral vein through the
popliteal and proximal calf veins.

[Series 1: us extrem low venous*right* · 14 of 57 slices shown]
[im 1/57]
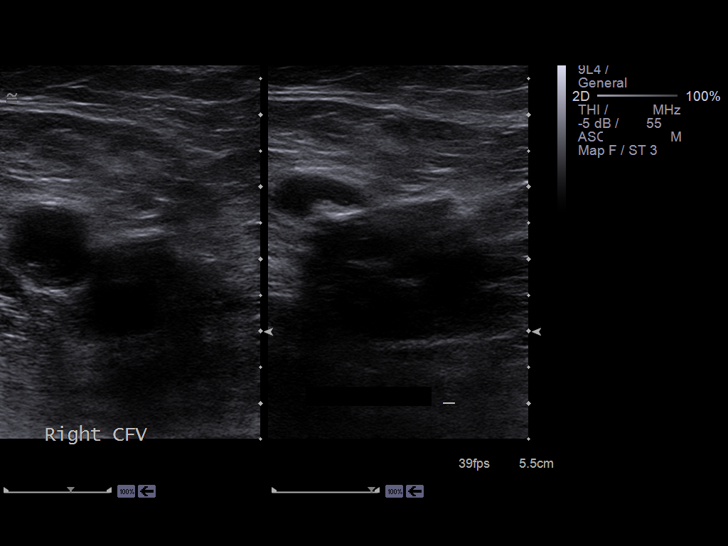
[im 5/57]
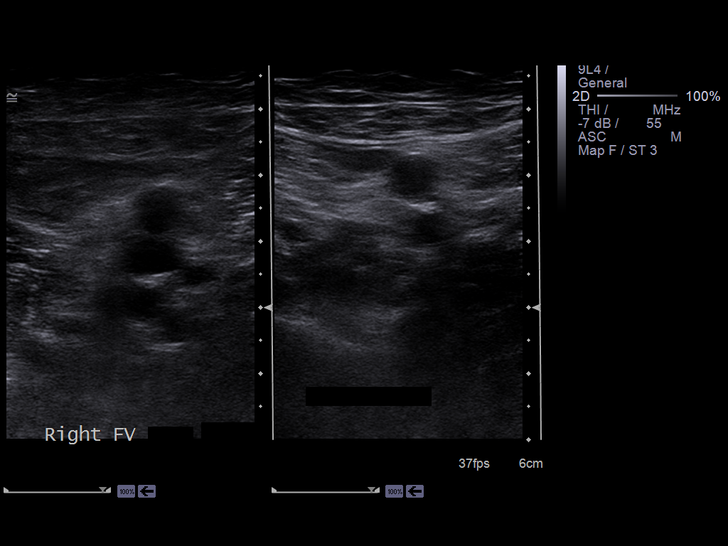
[im 10/57]
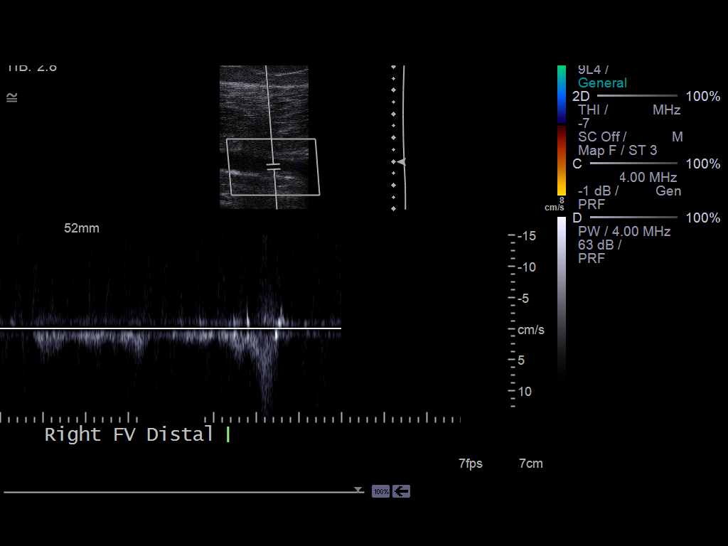
[im 15/57]
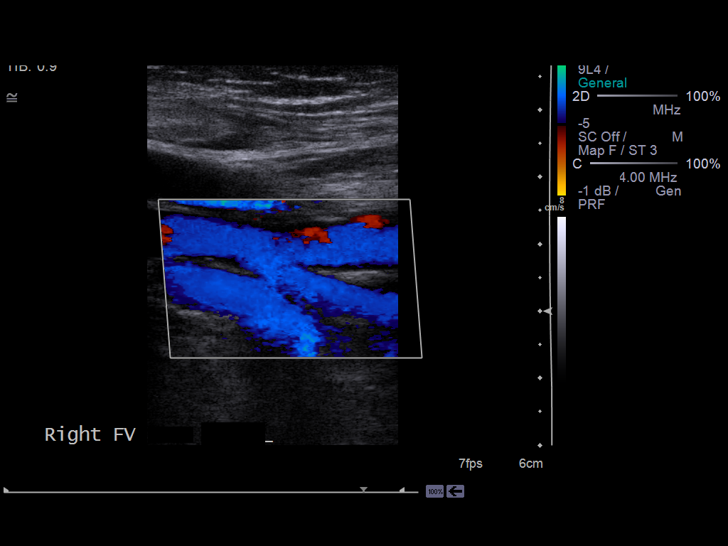
[im 18/57]
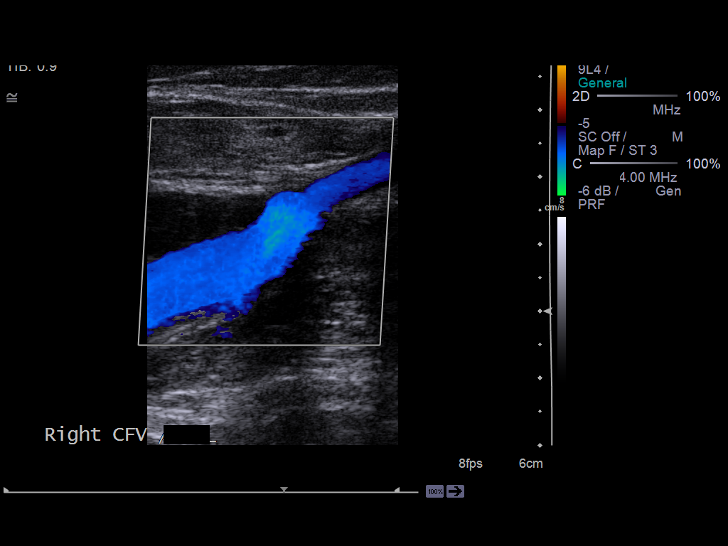
[im 22/57]
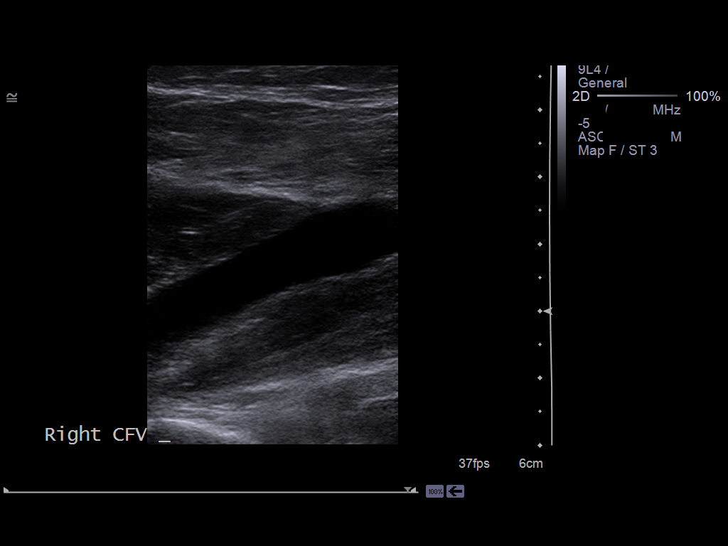
[im 27/57]
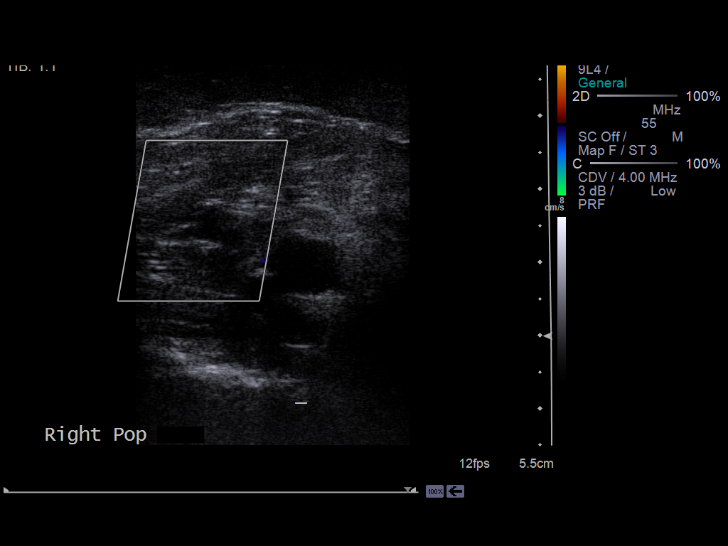
[im 30/57]
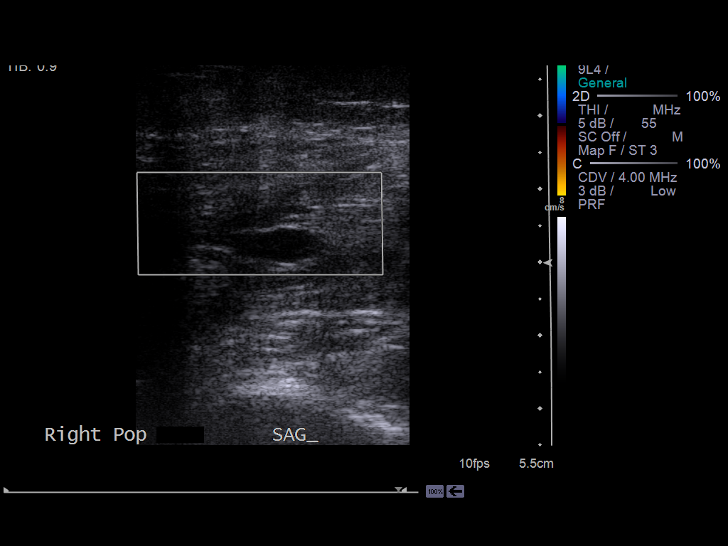
[im 35/57]
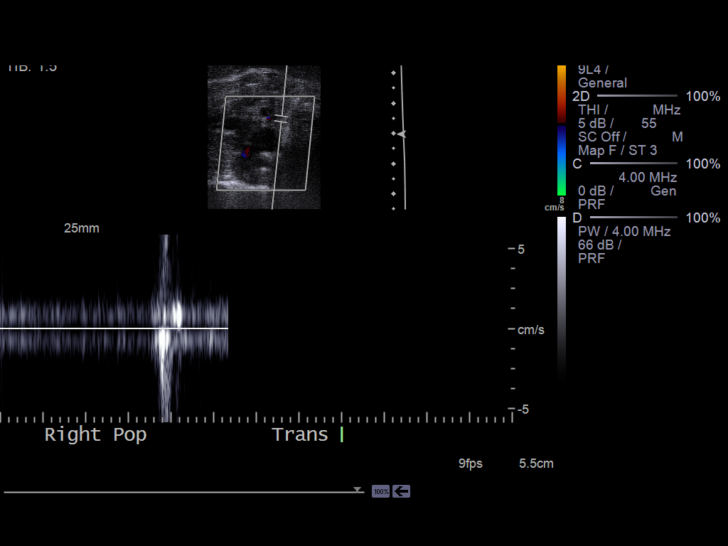
[im 39/57]
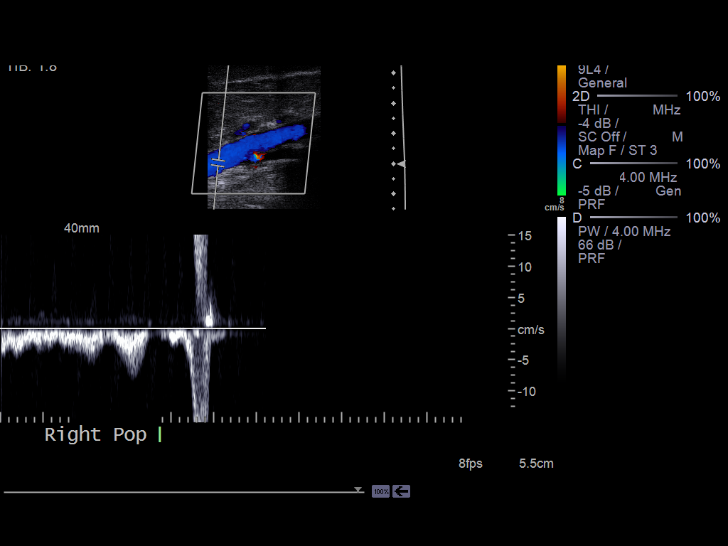
[im 44/57]
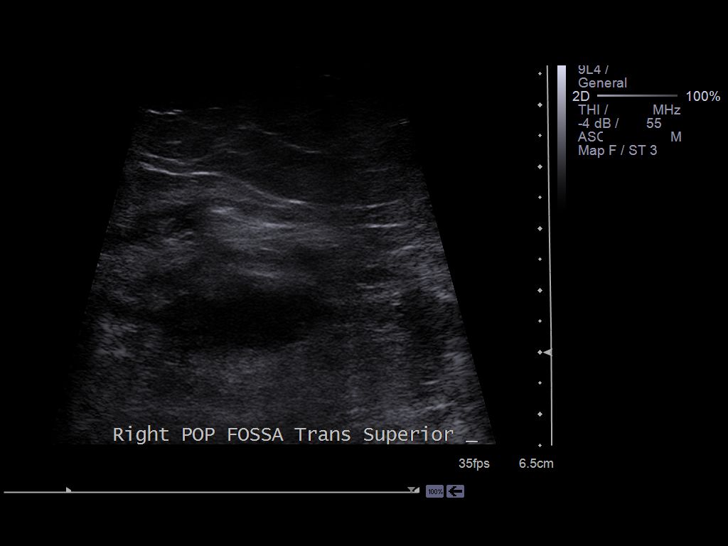
[im 47/57]
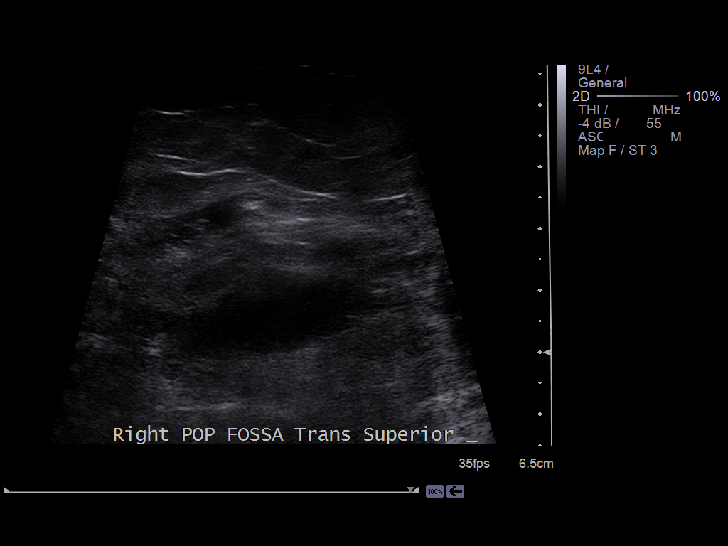
[im 52/57]
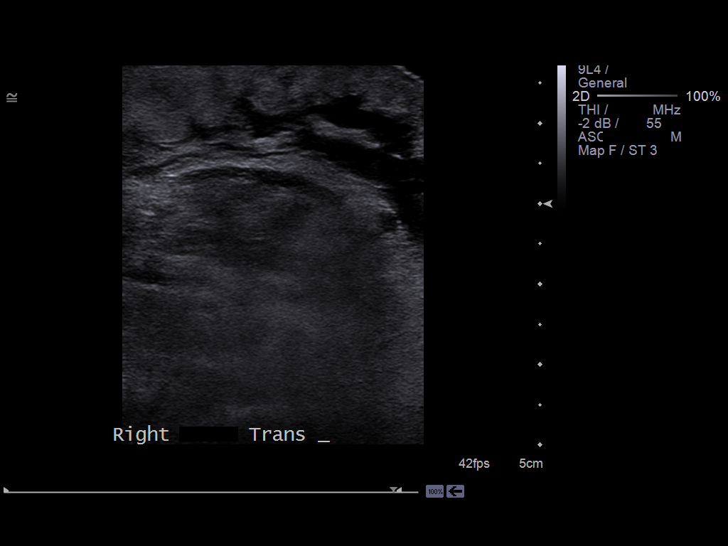
[im 57/57]
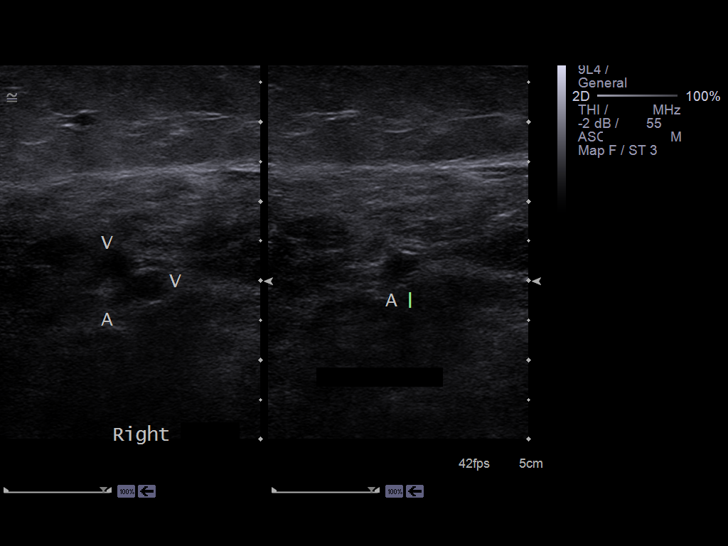

[14 of 24 positions shown; findings below may reference images not displayed]

FINDINGS: Normal compressibility of  the common femoral,
superficial femoral, and popliteal veins, as well as the proximal
calf veins.  No filling defects to suggest DVT on grayscale or
color Doppler imaging.  Doppler waveforms show normal direction of
venous flow, normal respiratory phasicity and response to
augmentation. There is a bilobed popliteal fossa cyst, largest
component 12 x 27 x 40 mm.  There is significant subcutaneous edema
at the ankle.
IMPRESSION: 1. No evidence of  lower extremity deep vein thrombosis.
2.  4 cm Baker's cyst.

## 2012-08-24 IMAGING — CR DG CHEST 1V PORT
1 series · 1 of 1 positions shown · non-contrast
Comparison: Portable chest x-ray of 02/16/2010

CLINICAL DATA: Sepsis, CHF

PORTABLE CHEST - 1 VIEW

[view not recorded]
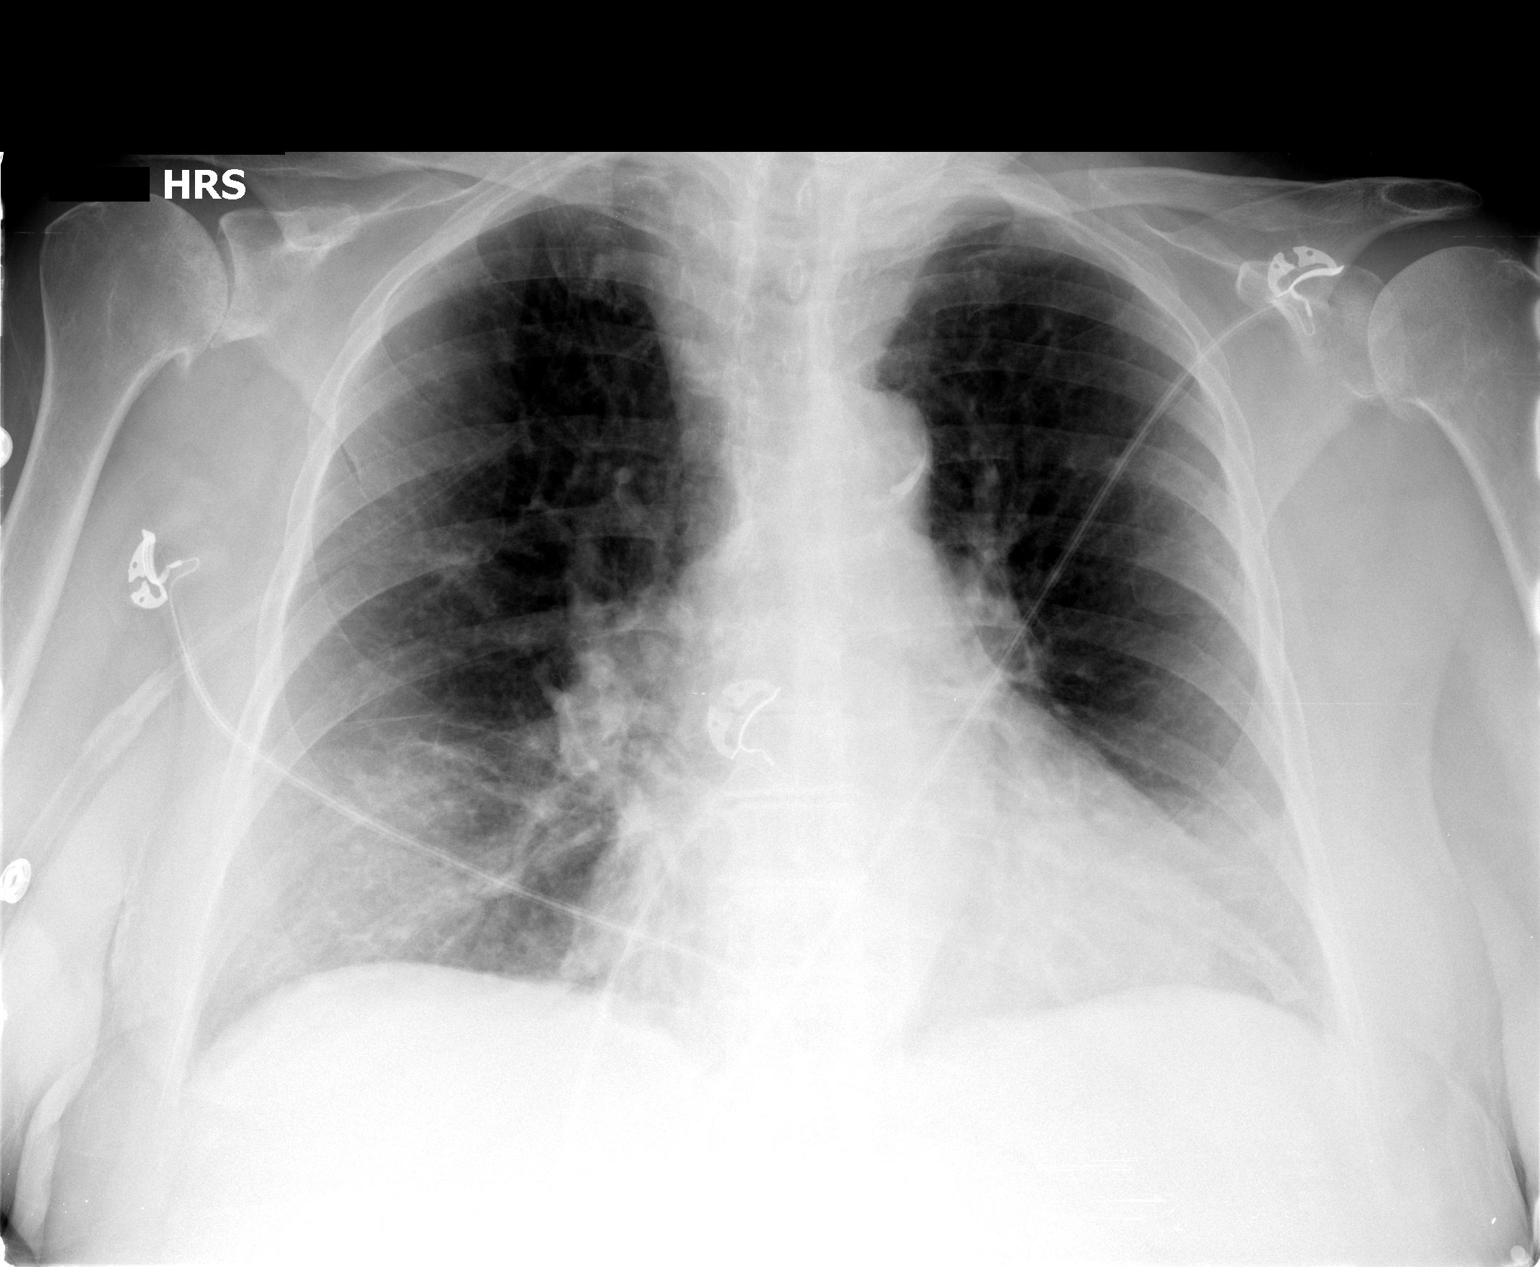

[1 of 1 positions shown; findings below may reference images not displayed]

FINDINGS: There has been improvement in mild pulmonary vascular
congestion.  Minimal haziness remains at the right lung base.
Cardiomegaly is stable.
IMPRESSION: Some improvement in pulmonary vascular congestion.
